# Patient Record
Sex: Male | Born: 1985 | Hispanic: No | Marital: Single | Smoking: Never smoker
Health system: Southern US, Community
[De-identification: ages and names within clinical notes are randomized; demographics above are authoritative.]

## PROBLEM LIST (undated history)

## (undated) DIAGNOSIS — J45909 Unspecified asthma, uncomplicated: Secondary | ICD-10-CM

---

## 2015-09-28 ENCOUNTER — Encounter (HOSPITAL_COMMUNITY): Payer: Self-pay | Admitting: Emergency Medicine

## 2015-09-28 ENCOUNTER — Ambulatory Visit (HOSPITAL_COMMUNITY)
Admission: EM | Admit: 2015-09-28 | Discharge: 2015-09-28 | Disposition: A | Discharge status: Home / Self Care | Payer: Self-pay | Source: Home / Self Care | Attending: Family Medicine | Admitting: Family Medicine

## 2015-09-28 DIAGNOSIS — J4521 Mild intermittent asthma with (acute) exacerbation: Secondary | ICD-10-CM

## 2015-09-28 HISTORY — DX: Unspecified asthma, uncomplicated: J45.909

## 2015-09-28 MED ORDER — METHYLPREDNISOLONE SODIUM SUCC 125 MG IJ SOLR
125.0000 mg | Freq: Once | INTRAMUSCULAR | Status: AC
  Administered 2015-09-28: 125 mg via INTRAMUSCULAR

## 2015-09-28 MED ORDER — IPRATROPIUM BROMIDE 0.02 % IN SOLN
RESPIRATORY_TRACT | Status: AC
  Filled 2015-09-28: qty 2.5

## 2015-09-28 MED ORDER — ALBUTEROL SULFATE HFA 108 (90 BASE) MCG/ACT IN AERS
2.0000 | INHALATION_SPRAY | Freq: Four times a day (QID) | RESPIRATORY_TRACT | Status: DC | PRN
  Administered 2015-09-28: 2 via RESPIRATORY_TRACT

## 2015-09-28 MED ORDER — IPRATROPIUM BROMIDE 0.02 % IN SOLN
0.5000 mg | Freq: Once | RESPIRATORY_TRACT | Status: AC
  Administered 2015-09-28: 0.5 mg via RESPIRATORY_TRACT

## 2015-09-28 MED ORDER — ALBUTEROL SULFATE (2.5 MG/3ML) 0.083% IN NEBU
5.0000 mg | INHALATION_SOLUTION | Freq: Once | RESPIRATORY_TRACT | Status: AC
  Administered 2015-09-28: 5 mg via RESPIRATORY_TRACT

## 2015-09-28 MED ORDER — ALBUTEROL SULFATE HFA 108 (90 BASE) MCG/ACT IN AERS
INHALATION_SPRAY | RESPIRATORY_TRACT | Status: AC
  Filled 2015-09-28: qty 6.7

## 2015-09-28 MED ORDER — METHYLPREDNISOLONE SODIUM SUCC 125 MG IJ SOLR
INTRAMUSCULAR | Status: AC
  Filled 2015-09-28: qty 2

## 2015-09-28 MED ORDER — ALBUTEROL SULFATE (2.5 MG/3ML) 0.083% IN NEBU
INHALATION_SOLUTION | RESPIRATORY_TRACT | Status: AC
  Filled 2015-09-28: qty 6

## 2015-09-28 NOTE — ED Notes (Signed)
Breathing difficulty for 2 months, history of asthma.  Stuffy nose, nose spray not helping.  Denies fever.  Reports generalized body aches, tired for one month.  Complains of sore throat.  Also has back pain.

## 2015-09-28 NOTE — ED Provider Notes (Signed)
CSN: [id number]     Arrival date & time 09/28/15  1513 History   First MD Initiated Contact with Patient 09/28/15 1616     Chief Complaint  Patient presents with  . Shortness of Breath   (Consider location/radiation/quality/duration/timing/severity/associated sxs/prior Treatment) Patient is a 30 y.o. male presenting with shortness of breath. The history is provided by the patient. The history is limited by a language barrier. A language interpreter was used.  Shortness of Breath Severity:  Mild Onset quality:  Gradual Duration:  2 months Progression:  Waxing and waning Chronicity:  Chronic Context: pollens and weather changes   Relieved by:  None tried Worsened by:  Nothing tried Ineffective treatments:  None tried Associated symptoms: wheezing   Associated symptoms: no fever     Past Medical History  Diagnosis Date  . Asthma    History reviewed. No pertinent past surgical history. No family history on file. Social History  Substance Use Topics  . Smoking status: Never Smoker   . Smokeless tobacco: None  . Alcohol Use: Yes    Review of Systems  Constitutional: Negative.  Negative for fever.  HENT: Negative.   Respiratory: Positive for shortness of breath and wheezing.   Cardiovascular: Negative.   All other systems reviewed and are negative.   Allergies  Review of patient's allergies indicates no known allergies.  Home Medications   Prior to Admission medications   Medication Sig Start Date End Date Taking? Authorizing Provider  OVER THE COUNTER MEDICATION "nose spray"   Yes Historical Provider, MD   Meds Ordered and Administered this Visit   Medications  albuterol (PROVENTIL HFA;VENTOLIN HFA) 108 (90 Base) MCG/ACT inhaler 2 puff (not administered)  methylPREDNISolone sodium succinate (SOLU-MEDROL) 125 mg/2 mL injection 125 mg (125 mg Intramuscular Given 09/28/15 1746)  albuterol (PROVENTIL) (2.5 MG/3ML) 0.083% nebulizer solution 5 mg (5 mg Nebulization Given  09/28/15 1727)  ipratropium (ATROVENT) nebulizer solution 0.5 mg (0.5 mg Nebulization Given 09/28/15 1727)    BP 165/99 mmHg  Pulse 71  Temp(Src) 98.5 F (36.9 C) (Oral)  Resp 18  SpO2 100% No data found.   Physical Exam  Constitutional: He is oriented to person, place, and time. He appears well-developed and well-nourished. No distress.  HENT:  Mouth/Throat: Oropharynx is clear and moist.  Neck: Normal range of motion. Neck supple.  Cardiovascular: Normal rate, normal heart sounds and intact distal pulses.   Pulmonary/Chest: Effort normal and breath sounds normal. No respiratory distress. He has no wheezes.  Abdominal: Soft. Bowel sounds are normal.  Lymphadenopathy:    He has no cervical adenopathy.  Neurological: He is alert and oriented to person, place, and time.  Nursing note and vitals reviewed.   ED Course  Procedures (including critical care time)  Labs Review Labs Reviewed - No data to display  Imaging Review No results found.   Visual Acuity Review  Right Eye Distance:   Left Eye Distance:   Bilateral Distance:    Right Eye Near:   Left Eye Near:    Bilateral Near:         MDM   1. Asthma exacerbation attacks, mild intermittent    Sx improved after neb. Meds ordered this encounter  Medications  . OVER THE COUNTER MEDICATION    Sig: "nose spray"  . methylPREDNISolone sodium succinate (SOLU-MEDROL) 125 mg/2 mL injection 125 mg    Sig:   . albuterol (PROVENTIL) (2.5 MG/3ML) 0.083% nebulizer solution 5 mg    Sig:   . ipratropium (  ATROVENT) nebulizer solution 0.5 mg    Sig:   . albuterol (PROVENTIL HFA;VENTOLIN HFA) 108 (90 Base) MCG/ACT inhaler 2 puff    Sig:       Linna HoffJames D Javaris Wigington, MD 09/28/15 1752

## 2015-09-28 NOTE — Discharge Instructions (Signed)
Return if any problems.

## 2018-09-11 ENCOUNTER — Other Ambulatory Visit: Payer: Self-pay

## 2018-09-11 ENCOUNTER — Ambulatory Visit (HOSPITAL_COMMUNITY)
Admission: EM | Admit: 2018-09-11 | Discharge: 2018-09-11 | Disposition: A | Discharge status: Home / Self Care | Payer: Self-pay | Attending: Family Medicine | Admitting: Family Medicine

## 2018-09-11 ENCOUNTER — Encounter (HOSPITAL_COMMUNITY): Payer: Self-pay

## 2018-09-11 DIAGNOSIS — J4521 Mild intermittent asthma with (acute) exacerbation: Secondary | ICD-10-CM

## 2018-09-11 DIAGNOSIS — J3089 Other allergic rhinitis: Secondary | ICD-10-CM

## 2018-09-11 MED ORDER — ALBUTEROL SULFATE HFA 108 (90 BASE) MCG/ACT IN AERS
INHALATION_SPRAY | RESPIRATORY_TRACT | Status: AC
  Filled 2018-09-11: qty 6.7

## 2018-09-11 MED ORDER — CETIRIZINE HCL 10 MG PO TABS: 10.0000 mg | ORAL_TABLET | Freq: Every day | ORAL | 0 refills | Status: DC

## 2018-09-11 MED ORDER — OLOPATADINE HCL 0.2 % OP SOLN: 1.0000 [drp] | Freq: Every day | OPHTHALMIC | 0 refills | Status: DC

## 2018-09-11 MED ORDER — ALBUTEROL SULFATE HFA 108 (90 BASE) MCG/ACT IN AERS
2.0000 | INHALATION_SPRAY | Freq: Once | RESPIRATORY_TRACT | Status: AC
  Administered 2018-09-11: 2 via RESPIRATORY_TRACT

## 2018-09-11 NOTE — ED Triage Notes (Signed)
Pt cc he has stomach and chest discomfort this started today. Pt states its a burning pain and his eyes itch and get watery.

## 2018-09-11 NOTE — ED Provider Notes (Signed)
[facility]    CSN: [id number] Arrival date & time: 09/11/18  1939     History   Chief Complaint Chief Complaint  Patient presents with  . Abdominal Pain    HPI Ladislaus Repsher is a 33 y.o. male.   33 year old male comes in for evaluation of chest pain and abdominal pain. HPI obtained by patient through video translator. Patient at first kept stating pain through the whole front of body. 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 During this process he has muscle soreness for the past 4 days that was at first intermittent, but now more constant. He has some wheezing and shortness of breath while he works, which improves slightly after relieved. He has a history of asthma, but does not have an inhaler to use. He has also noted to have 1 month history of right eye pain that waters and itches. He also has rhinorrhea, nasal congestion, sneezing. Patient seems to be eating and drinking without problems. No obvious nausea, vomiting.      Past Medical History:  Diagnosis Date  . Asthma     There are no active problems to display for this patient.   History reviewed. No pertinent surgical history.     Home Medications    Prior to Admission medications   Medication Sig Start Date End Date Taking? Authorizing Provider  cetirizine (ZYRTEC) 10 MG tablet Take 1 tablet (10 mg total) by mouth daily. 09/11/18   Cathie Hoops, Reeta Kuk V, PA-C  Olopatadine HCl 0.2 % SOLN Apply 1 drop to eye daily. 09/11/18   Belinda Fisher, PA-C  OVER THE COUNTER MEDICATION "nose spray"    [provider]    Family History History reviewed. No pertinent family history.  Social History Social History   Tobacco Use  . Smoking status: Never Smoker  . Smokeless tobacco: Never Used  Substance Use Topics  . Alcohol use: Yes  . Drug use: No     Allergies   Patient has no known allergies.   Review of Systems Review of  Systems  Reason unable to perform ROS: See HPI as above.     Physical Exam Triage Vital Signs ED Triage Vitals  Enc Vitals Group     BP 09/11/18 1946 131/87     Pulse Rate 09/11/18 1946 87     Resp 09/11/18 1946 16     Temp 09/11/18 1946 97.8 F (36.6 C)     Temp Source 09/11/18 1946 Oral     SpO2 09/11/18 1946 100 %     Weight 09/11/18 1948 118 lb 6.4 oz (53.7 kg)     Height --      Head Circumference --      Peak Flow --      Pain Score 09/11/18 1947 5     Pain Loc --      Pain Edu? --      Excl. in GC? --    No data found.  Updated Vital Signs BP 131/87 (BP Location: Right Arm)   Pulse 87   Temp 97.8 F (36.6 C) (Oral)   Resp 16   Wt 118 lb 6.4 oz (53.7 kg)   SpO2 100%   Physical Exam Constitutional:      General: He is not in acute distress.    Appearance: He is well-developed. He is not ill-appearing, toxic-appearing or diaphoretic.  HENT:     Head: Normocephalic  and atraumatic.     Right Ear: Tympanic membrane, ear canal and external ear normal. Tympanic membrane is not erythematous or bulging.     Left Ear: Tympanic membrane, ear canal and external ear normal. Tympanic membrane is not erythematous or bulging.     Nose: Nose normal.     Right Sinus: No maxillary sinus tenderness or frontal sinus tenderness.     Left Sinus: No maxillary sinus tenderness or frontal sinus tenderness.     Mouth/Throat:     Mouth: Mucous membranes are moist.     Pharynx: Oropharynx is clear. Uvula midline.  Eyes:     General: Lids are normal. Lids are everted, no foreign bodies appreciated.     Extraocular Movements: Extraocular movements intact.     Conjunctiva/sclera:     Right eye: Right conjunctiva is injected.     Left eye: Left conjunctiva is injected.     Pupils: Pupils are equal, round, and reactive to light.     Comments: Patient with right nasal pinguecula.  Neck:     Musculoskeletal: Normal range of motion and neck supple.  Cardiovascular:     Rate and Rhythm:  Normal rate and regular rhythm.     Heart sounds: Normal heart sounds. No murmur. No friction rub. No gallop.   Pulmonary:     Effort: Pulmonary effort is normal. No accessory muscle usage, prolonged expiration, respiratory distress or retractions.     Breath sounds: Normal breath sounds. No stridor, decreased air movement or transmitted upper airway sounds. No decreased breath sounds, wheezing, rhonchi or rales.  Abdominal:     General: Bowel sounds are normal.     Palpations: Abdomen is soft.     Tenderness: There is no abdominal tenderness. There is no right CVA tenderness, left CVA tenderness, guarding or rebound.  Skin:    General: Skin is warm and dry.  Neurological:     Mental Status: He is alert and oriented to person, place, and time.      UC Treatments / Results  Labs (all labs ordered are listed, but only abnormal results are displayed) Labs Reviewed - No data to display  EKG None  Radiology No results found.  Procedures Procedures (including critical care time)  Medications Ordered in UC Medications  albuterol (PROVENTIL HFA;VENTOLIN HFA) 108 (90 Base) MCG/ACT inhaler 2 puff (2 puffs Inhalation Given 09/11/18 2021)    Initial Impression / Assessment and Plan / UC Course  I have reviewed the triage vital signs and the nursing notes.  Pertinent labs & imaging results that were available during my care of the patient were reviewed by me and considered in my medical decision making (see chart for details).    Discussed possible asthma exacerbation given recent new job requiring strenuous activity.  Lungs clear to auscultation bilaterally without adventitious lung sounds at this time.  Will provide albuterol inhaler.  History and exam also consistent with allergies with possible allergic conjunctivitis.  Will provide antihistamine, Pataday as directed.  Return precautions given.  Patient expresses understanding and agrees to plan.  Final Clinical Impressions(s) / UC  Diagnoses   Final diagnoses:  Mild intermittent asthma with acute exacerbation  Non-seasonal allergic rhinitis due to other allergic trigger    ED Prescriptions    Medication Sig Dispense Auth. Provider   cetirizine (ZYRTEC) 10 MG tablet Take 1 tablet (10 mg total) by mouth daily. 30 tablet Aryahna Spagna V, PA-C   Olopatadine HCl 0.2 % SOLN Apply 1 drop to  eye daily. 2.5 mL Threasa Alpha, [place] 09/11/18 2026

## 2018-09-11 NOTE — Discharge Instructions (Signed)
Use albuterol every 4-6 hours as needed for shortness of breath/wheezing. Start zyrtec as directed. Pataday for the eyes to help the irritation. Follow up with PCP for further evaluation if symptoms not improving.

## 2018-10-20 ENCOUNTER — Encounter (HOSPITAL_COMMUNITY): Payer: Self-pay | Admitting: *Deleted

## 2018-10-20 ENCOUNTER — Other Ambulatory Visit: Payer: Self-pay

## 2018-10-20 ENCOUNTER — Emergency Department (HOSPITAL_COMMUNITY)
Admission: EM | Admit: 2018-10-20 | Discharge: 2018-10-20 | Disposition: A | Discharge status: Home / Self Care | Payer: Self-pay | Attending: Emergency Medicine | Admitting: Emergency Medicine

## 2018-10-20 ENCOUNTER — Emergency Department (HOSPITAL_COMMUNITY): Payer: Self-pay

## 2018-10-20 DIAGNOSIS — S91109A Unspecified open wound of unspecified toe(s) without damage to nail, initial encounter: Secondary | ICD-10-CM | POA: Insufficient documentation

## 2018-10-20 DIAGNOSIS — S92515A Nondisplaced fracture of proximal phalanx of left lesser toe(s), initial encounter for closed fracture: Secondary | ICD-10-CM | POA: Insufficient documentation

## 2018-10-20 DIAGNOSIS — Y939 Activity, unspecified: Secondary | ICD-10-CM | POA: Insufficient documentation

## 2018-10-20 DIAGNOSIS — W109XXA Fall (on) (from) unspecified stairs and steps, initial encounter: Secondary | ICD-10-CM | POA: Insufficient documentation

## 2018-10-20 DIAGNOSIS — Y929 Unspecified place or not applicable: Secondary | ICD-10-CM | POA: Insufficient documentation

## 2018-10-20 DIAGNOSIS — Z23 Encounter for immunization: Secondary | ICD-10-CM | POA: Insufficient documentation

## 2018-10-20 DIAGNOSIS — S93105A Unspecified dislocation of left toe(s), initial encounter: Secondary | ICD-10-CM | POA: Insufficient documentation

## 2018-10-20 DIAGNOSIS — Y999 Unspecified external cause status: Secondary | ICD-10-CM | POA: Insufficient documentation

## 2018-10-20 MED ORDER — TETANUS-DIPHTH-ACELL PERTUSSIS 5-2.5-18.5 LF-MCG/0.5 IM SUSP
0.5000 mL | Freq: Once | INTRAMUSCULAR | Status: AC
  Administered 2018-10-20: 05:00:00 0.5 mL via INTRAMUSCULAR
  Filled 2018-10-20: qty 0.5

## 2018-10-20 MED ORDER — OXYCODONE-ACETAMINOPHEN 5-325 MG PO TABS
1.0000 | ORAL_TABLET | Freq: Once | ORAL | Status: AC
  Administered 2018-10-20: 1 via ORAL
  Filled 2018-10-20: qty 1

## 2018-10-20 MED ORDER — LIDOCAINE HCL (PF) 1 % IJ SOLN
5.0000 mL | Freq: Once | INTRAMUSCULAR | Status: AC
  Administered 2018-10-20: 06:00:00 5 mL
  Filled 2018-10-20: qty 5

## 2018-10-20 MED ORDER — SODIUM CHLORIDE 0.9 % IV SOLN
3.0000 g | Freq: Once | INTRAVENOUS | Status: AC
  Administered 2018-10-20: 05:00:00 3 g via INTRAVENOUS
  Filled 2018-10-20: qty 3

## 2018-10-20 MED ORDER — HYDROCODONE-ACETAMINOPHEN 5-325 MG PO TABS: 1.0000 | ORAL_TABLET | Freq: Four times a day (QID) | ORAL | 0 refills | Status: DC | PRN

## 2018-10-20 MED ORDER — CEFAZOLIN SODIUM-DEXTROSE 2-4 GM/100ML-% IV SOLN: 2.0000 g | Freq: Once | INTRAVENOUS | Status: DC

## 2018-10-20 MED ORDER — AMOXICILLIN-POT CLAVULANATE 875-125 MG PO TABS
1.0000 | ORAL_TABLET | Freq: Two times a day (BID) | ORAL | 0 refills | Status: AC
Start: 2018-10-20 — End: 2018-10-27

## 2018-10-20 MED ORDER — LIDOCAINE-EPINEPHRINE-TETRACAINE (LET) SOLUTION
3.0000 mL | Freq: Once | NASAL | Status: DC
  Filled 2018-10-20: qty 3

## 2018-10-20 MED ORDER — TETANUS-DIPHTH-ACELL PERTUSSIS 5-2.5-18.5 LF-MCG/0.5 IM SUSP: 0.5000 mL | Freq: Once | INTRAMUSCULAR | Status: DC

## 2018-10-20 MED ORDER — LIDOCAINE HCL (PF) 1 % IJ SOLN
10.0000 mL | Freq: Once | INTRAMUSCULAR | Status: DC
  Filled 2018-10-20: qty 10

## 2018-10-20 MED ORDER — FENTANYL CITRATE (PF) 100 MCG/2ML IJ SOLN
50.0000 ug | Freq: Once | INTRAMUSCULAR | Status: DC
Start: 2018-10-20 — End: 2018-10-20

## 2018-10-20 NOTE — ED Provider Notes (Signed)
Medical screening examination/treatment/procedure(s) were conducted as a shared visit with non-physician practitioner(s) and myself.  I personally evaluated the patient during the encounter.  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 Open interphalangeal joint, unstable.  Plan for abx, clean, ortho consult for further recommendations.   None     Latayvia Mandujano, Barbara Cower, MD 10/21/18 [phone]

## 2018-10-20 NOTE — ED Provider Notes (Signed)
[facility]   CSN: [id number] Arrival date & time: 10/20/18  0055    History   Chief Complaint Chief Complaint  Patient presents with  . Foot Injury    HPI Corey Anthony is a 33 y.o. male.     HPI  Patient is 33 year old male with past medical history of asthma presenting for foot injury.  Patient reports that he missed the last 2 steps, tripped, and his foot went into the nail.  He reports that he lacerated the bottom of his right foot.  Patient was barefoot at the time.  He denies any loss of sensation.  He reports he was unable to walk on the foot due to the pain.  He did not sustain any other injuries in the incident and did not hit his head.  Patient reports that his most recent tetanus shot was within the last year given by his employer.  A [place] interpreter was used for the entirety of this encounter.  Past Medical History:  Diagnosis Date  . Asthma     There are no active problems to display for this patient.   History reviewed. No pertinent surgical history.      Home Medications    Prior to Admission medications   Medication Sig Start Date End Date Taking? Authorizing Provider  cetirizine (ZYRTEC) 10 MG tablet Take 1 tablet (10 mg total) by mouth daily. 09/11/18   Cathie HoopsYu, Amy V, PA-C  Olopatadine HCl 0.2 % SOLN Apply 1 drop to eye daily. 09/11/18   Belinda FisherYu, Amy V, PA-C  OVER THE COUNTER MEDICATION "nose spray"    [provider]    Family History No family history on file.  Social History Social History   Tobacco Use  . Smoking status: Never Smoker  . Smokeless tobacco: Never Used  Substance Use Topics  . Alcohol use: Yes  . Drug use: No     Allergies   Patient has no known allergies.   Review of Systems Review of Systems  Musculoskeletal: Positive for arthralgias.  Skin: Positive for wound.  Neurological: Negative for syncope, weakness and numbness.  All other systems reviewed and  are negative.    Physical Exam Updated Vital Signs BP (!) 140/122 (BP Location: Right Arm)   Pulse (!) 106   Temp 98.2 F (36.8 C) (Oral)   Resp 20   Ht 5' (1.524 m)   Wt 53.7 kg   BMI 23.12 kg/m   Physical Exam Vitals signs and nursing note reviewed.  Constitutional:      General: He is not in acute distress.    Appearance: He is well-developed. He is not diaphoretic.     Comments: Sitting comfortably in bed.  HENT:     Head: Normocephalic and atraumatic.  Eyes:     General:        Right eye: No discharge.        Left eye: No discharge.     Conjunctiva/sclera: Conjunctivae normal.     Comments: EOMs normal to gross examination.  Neck:     Musculoskeletal: Normal range of motion.  Cardiovascular:     Rate and Rhythm: Normal rate and regular rhythm.     Comments: Intact, 2+ DP pulse bilaterally. Pulmonary:     Comments: Patient converses comfortably without audible wheeze or stridor. Abdominal:     General: There is no distension.  Musculoskeletal: Normal range of motion.     Comments: See clinical photo for  details.  Patient has exposed bone of proximal phalanx of the fifth digit of the left toe.  There is a laceration at the base of the fifth toe.  Patient also has a laceration without exposed bone at the base of the left fourth toe.  Skin:    General: Skin is warm and dry.  Neurological:     Mental Status: He is alert.     Comments: Cranial nerves intact to gross observation. Patient moves extremities without difficulty.  Psychiatric:        Behavior: Behavior normal.        Thought Content: Thought content normal.        Judgment: Judgment normal.            ED Treatments / Results  Labs (all labs ordered are listed, but only abnormal results are displayed) Labs Reviewed - No data to display  EKG None  Radiology No results found.  Procedures Irrigation Date/Time: 10/20/2018 7:08 AM Performed by: Elisha Ponder, PA-C Authorized by:  Elisha Ponder, PA-C  Consent: Verbal consent obtained. Risks and benefits: risks, benefits and alternatives were discussed Consent given by: patient Patient understanding: patient states understanding of the procedure being performed Patient consent: the patient's understanding of the procedure matches consent given Patient identity confirmed: verbally with patient Preparation: Patient was prepped and draped in the usual sterile fashion. Local anesthesia used: yes Anesthesia: digital block  Anesthesia: Local anesthesia used: yes Local Anesthetic: lidocaine 1% without epinephrine Anesthetic total: 1000 mL  Sedation: Patient sedated: no  Patient tolerance: Patient tolerated the procedure well with no immediate complications  .Marland KitchenLaceration Repair Date/Time: 10/20/2018 7:10 AM Performed by: Elisha Ponder, PA-C Authorized by: Elisha Ponder, PA-C   Consent:    Consent obtained:  Verbal   Consent given by:  Patient   Risks discussed:  Infection and pain Anesthesia (see MAR for exact dosages):    Anesthesia method:  Nerve block   Block needle gauge:  25 G   Block anesthetic:  Lidocaine 1% w/o epi   Block technique:  Digital block   Block injection procedure:  Anatomic landmarks identified, introduced needle, incremental injection, negative aspiration for blood and anatomic landmarks palpated   Block outcome:  Anesthesia achieved Laceration details:    Location:  Toe   Toe location:  L little toe   Length (cm):  3 Repair type:    Repair type:  Simple Pre-procedure details:    Preparation:  Patient was prepped and draped in usual sterile fashion Exploration:    Hemostasis achieved with:  Direct pressure   Wound exploration: wound explored through full range of motion     Contaminated: no   Treatment:    Area cleansed with:  Betadine   Amount of cleaning:  Extensive   Irrigation solution:  Sterile water   Irrigation volume:  1000 ml   Irrigation method:  Syringe  Skin repair:    Repair method:  Sutures   Suture size:  4-0   Suture material:  Prolene   Suture technique:  Simple interrupted   Number of sutures:  3 Approximation:    Approximation:  Close Post-procedure details:    Dressing:  Non-adherent dressing and bulky dressing (Buddy taping for protection)   Patient tolerance of procedure:  Tolerated well, no immediate complications .Marland KitchenLaceration Repair Date/Time: 10/20/2018 7:12 AM Performed by: Elisha Ponder, PA-C Authorized by: Elisha Ponder, PA-C   Consent:    Consent obtained:  Verbal  Consent given by:  Patient   Risks discussed:  Infection and pain Anesthesia (see MAR for exact dosages):    Anesthesia method:  Nerve block   Block needle gauge:  25 G   Block anesthetic:  Lidocaine 1% w/o epi   Block technique:  Digital block   Block injection procedure:  Anatomic landmarks identified, introduced needle, incremental injection, negative aspiration for blood and anatomic landmarks palpated   Block outcome:  Anesthesia achieved Laceration details:    Location:  Toe   Toe location:  L fourth toe   Length (cm):  2 Repair type:    Repair type:  Simple Pre-procedure details:    Preparation:  Patient was prepped and draped in usual sterile fashion Exploration:    Hemostasis achieved with:  Direct pressure   Wound exploration: wound explored through full range of motion     Contaminated: no   Treatment:    Area cleansed with:  Betadine   Amount of cleaning:  Extensive   Irrigation solution:  Sterile water   Irrigation volume:  500 ml   Irrigation method:  Syringe Skin repair:    Repair method:  Sutures   Suture size:  4-0   Suture material:  Prolene   Suture technique:  Simple interrupted   Number of sutures:  2 Approximation:    Approximation:  Close Post-procedure details:    Dressing:  Non-adherent dressing and sterile dressing (Buddy taping for protection)   Patient tolerance of procedure:  Tolerated well, no  immediate complications   (including critical care time)  Medications Ordered in ED Medications  oxyCODONE-acetaminophen (PERCOCET/ROXICET) 5-325 MG per tablet 1 tablet (has no administration in time range)  lidocaine-EPINEPHrine-tetracaine (LET) solution (has no administration in time range)       Initial Impression / Assessment and Plan / ED Course  I have reviewed the triage vital signs and the nursing notes.  Pertinent labs & imaging results that were available during my care of the patient were reviewed by me and considered in my medical decision making (see chart for details).  Clinical Course as of Oct 20 634  Sun Oct 20, 2018  0428 Spoke with Dr. Duwayne Heck who will review films and return call. Appreciate his involvement.    [AM]  [phone] Dr. Aundria Rud orthopedics reviewed clinical photos and recommended tacking the left fifth toe back together with 3-4 sutures.  Recommends Unasyn followed by Augmentin as an outpatient.  He will see the patient in 5 days in clinic.  Recommends buddy taping all affected toes, post op shoe.  Appreciate his involvement.   [AM]    Clinical Course User Index [AM] Elisha Ponder, PA-C       Patient is nontoxic-appearing neurovascularly intact in the left lower extremity.  Patient has a partial amputation/open dislocation of the proximal and distal phalanx of the left fifth toe.  Area was copiously cleansed with 1 L of sterile water.  Affected toes were approximated together.  All toes buddy wrap together.  Patient instructed that he must take antibiotics, and follow-up with orthopedics in 5 days with a Burmese interpreter present.  Patient was instructed on proper wound care and dressing changes.  Patient given return precautions for any increasing pain, swelling, or purulent drainage.  Patient is in understanding and agrees with plan of care.  This is a shared visit with Dr. Marily Memos. Patient was independently evaluated by this attending  physician. Attending physician consulted in evaluation and discharge management.  Final  Clinical Impressions(s) / ED Diagnoses   Final diagnoses:  Open dislocation of phalanx of foot, left, initial encounter  Closed nondisplaced fracture of proximal phalanx of lesser toe of left foot, initial encounter    ED Discharge Orders         Ordered    amoxicillin-clavulanate (AUGMENTIN) 875-125 MG tablet  Every 12 hours     10/20/18 0642    HYDROcodone-acetaminophen (NORCO/VICODIN) 5-325 MG tablet  Every 6 hours PRN     10/20/18 0642           Elisha Ponder, PA-C 10/20/18 0719    Mesner, Barbara Cower, MD 10/21/18 [phone]

## 2018-10-20 NOTE — Progress Notes (Signed)
Orthopedic Tech Progress Note Patient Details:  Corey Anthony 1986-03-24 [id number]  Ortho Devices Type of Ortho Device: Crutches, Postop shoe/boot Ortho Device/Splint Location: LLE Ortho Device/Splint Interventions: Adjustment, Application, Ordered   Post Interventions Patient Tolerated: Well Instructions Provided: Poper ambulation with device, Care of device, Adjustment of device   Donald Pore 10/20/2018, 7:24 AM

## 2018-10-20 NOTE — Discharge Instructions (Signed)
Please see the information and instructions below regarding your visit.  Your diagnoses today include:  1. Open dislocation of phalanx of foot, left, initial encounter   2. Closed nondisplaced fracture of proximal phalanx of lesser toe of left foot, initial encounter     Tests performed today include: X-ray of the affected area showed a fracture in your third toe.  Vital signs. See below for your results today.   Medications prescribed:   Take any prescribed medications only as directed.   Please take all of your antibiotics until finished.   Take the antibiotics twice daily for the next 7 days.  You may develop abdominal discomfort or nausea from the antibiotic. If this occurs, you may take it with food. Some patients also get diarrhea with antibiotics. You may help offset this with probiotics which you can buy or get in yogurt. Do not eat or take the probiotics until 2 hours after your antibiotic. Some women develop vaginal yeast infections after antibiotics. If you develop unusual vaginal discharge after being on this medication, please see your primary care provider.   Some people develop allergies to antibiotics. Symptoms of antibiotic allergy can be mild and include a flat rash and itching. They can also be more serious and include:  ?Hives - Hives are raised, red patches of skin that are usually very itchy.  ?Lip or tongue swelling  ?Trouble swallowing or breathing  ?Blistering of the skin or mouth.  If you have any of these serious symptoms, please seek emergency medical care immediately.   You have been prescribed Norco for pain. This is an opioid pain medication. You may take this medication every 4-6 hours as needed for pain. Only take this medication if you need it for breakthrough pain. You may combine this medicine with ibuprofen, a non-steroidal anti-inflammatory drug (NSAID) every 6 hours, so you are getting something for pain relief every 3 hours.  Do not combine this  medication with Tylenol, as it may increase the risk of liver problems.  Do not combine this medication with alcohol.  Please be advised to avoid driving or operating heavy machinery while taking this medication, as it may make you drowsy or impair judgment.    Home care instructions:  Follow any educational materials and wound care instructions contained in this packet.   Keep affected area above the level of your heart when possible to minimize swelling. Wash area gently twice a day with warm soapy water. Do not apply alcohol or hydrogen peroxide directly over a wound. Cover the area if it is draining or weeping. Keep the bandage in place for 24 hours and refrain from getting the wound wet for 24 hours. After that, you may get the area wet, but please ensure that you dry it completely afterwards.  Please apply the yellow gauze on top of the wound first, followed by the white gauze.  Please then wrap your toes together with the stretchy gauze. You will need to wear the postoperative shoe and weight-bear on the left foot as tolerated.  Use the crutches as needed.  Follow-up instructions: Suture Removal: Dr. Aundria Rudogers will take out your sutures.  Return instructions:  Return to the Emergency Department if you have: Fever Worsening pain Worsening swelling of the wound Pus draining from the wound Redness of the skin that moves away from the wound, especially if it streaks away from the affected area  Any other emergent concerns  Your vital signs today were: BP 116/76  Pulse 83    Temp 98.2 F (36.8 C) (Oral)    Resp 20    Ht 5' (1.524 m)    Wt 53.7 kg    SpO2 95%    BMI 23.12 kg/m  If your blood pressure (BP) was elevated on multiple readings during this visit above 130 for the top number or above 80 for the bottom number, please have this repeated by your primary care provider within one month. --------------  Thank you for allowing [place] to participate in your care today! It was a  pleasure taking care of you.

## 2018-10-20 NOTE — ED Triage Notes (Signed)
The pt speaks burmise he speaks little or no english  He fell earlier tonight c/o pain in his lt foot with sl swelling

## 2018-10-31 ENCOUNTER — Encounter (HOSPITAL_COMMUNITY): Payer: Self-pay | Admitting: Emergency Medicine

## 2018-10-31 ENCOUNTER — Other Ambulatory Visit: Payer: Self-pay

## 2018-10-31 ENCOUNTER — Emergency Department (HOSPITAL_COMMUNITY)
Admission: EM | Admit: 2018-10-31 | Discharge: 2018-10-31 | Disposition: A | Discharge status: Home / Self Care | Payer: Self-pay | Attending: Emergency Medicine | Admitting: Emergency Medicine

## 2018-10-31 DIAGNOSIS — R509 Fever, unspecified: Secondary | ICD-10-CM | POA: Insufficient documentation

## 2018-10-31 DIAGNOSIS — Z5189 Encounter for other specified aftercare: Secondary | ICD-10-CM

## 2018-10-31 DIAGNOSIS — Z4802 Encounter for removal of sutures: Secondary | ICD-10-CM | POA: Insufficient documentation

## 2018-10-31 DIAGNOSIS — J45909 Unspecified asthma, uncomplicated: Secondary | ICD-10-CM | POA: Insufficient documentation

## 2018-10-31 DIAGNOSIS — S91312D Laceration without foreign body, left foot, subsequent encounter: Secondary | ICD-10-CM | POA: Insufficient documentation

## 2018-10-31 DIAGNOSIS — Z79899 Other long term (current) drug therapy: Secondary | ICD-10-CM | POA: Insufficient documentation

## 2018-10-31 DIAGNOSIS — S92505D Nondisplaced unspecified fracture of left lesser toe(s), subsequent encounter for fracture with routine healing: Secondary | ICD-10-CM | POA: Insufficient documentation

## 2018-10-31 DIAGNOSIS — X58XXXD Exposure to other specified factors, subsequent encounter: Secondary | ICD-10-CM | POA: Insufficient documentation

## 2018-10-31 MED ORDER — SULFAMETHOXAZOLE-TRIMETHOPRIM 800-160 MG PO TABS: 1.0000 | ORAL_TABLET | Freq: Two times a day (BID) | ORAL | 0 refills | Status: AC

## 2018-10-31 MED ORDER — DOUBLE ANTIBIOTIC 500-10000 UNIT/GM EX OINT
TOPICAL_OINTMENT | Freq: Once | CUTANEOUS | Status: AC
  Administered 2018-10-31: 17:00:00 via TOPICAL
  Filled 2018-10-31: qty 28.4

## 2018-10-31 NOTE — Discharge Instructions (Signed)
Please call Dr. Aundria Rud office TODAY to schedule an appointment. It is important that you are seen by them. Please take antibiotics as prescribed.

## 2018-10-31 NOTE — ED Triage Notes (Signed)
Patient seen in the ED 10/20/18 walking and tripped onto a heater and injured left foot. Sutures placed and concerned of bleeding around sutures and to have them possibly removed.

## 2018-11-01 NOTE — ED Provider Notes (Signed)
[facility] Note   CSN: [id number] Arrival date & time: 10/31/18  1344    History   Chief Complaint Chief Complaint  Patient presents with  . Wound Check  . Suture / Staple Removal    HPI Corey Anthony is a 33 y.o. male who presents to the ED for wound check/suture removal. Pt was seen on 04/26 for a foot injury with lacerations to his 4th and 5th digit on left foot as well as a closed non displaced fracture of 3rd proximal phalanx. He had 5 sutures placed in total and was given a follow up with orthopedist Dr. Aundria Rudogers on 05/01. Pt returns today with sutures still in place complaining of mild pain and bleeding around the site. Through interpretor services he reports he has not seen orthopedist and believes it was due to a communication error given the language barrier. Pt denies fever, chills, drainage, erythema, or any other associated symptoms.      The history is provided by the patient. The history is limited by a language barrier. A language interpreter was used.    Past Medical History:  Diagnosis Date  . Asthma     There are no active problems to display for this patient.   History reviewed. No pertinent surgical history.      Home Medications    Prior to Admission medications   Medication Sig Start Date End Date Taking? Authorizing Provider  cetirizine (ZYRTEC) 10 MG tablet Take 1 tablet (10 mg total) by mouth daily. 09/11/18   Cathie HoopsYu, Amy V, PA-C  HYDROcodone-acetaminophen (NORCO/VICODIN) 5-325 MG tablet Take 1-2 tablets by mouth every 6 (six) hours as needed. 10/20/18   Aviva KluverMurray, Alyssa B, PA-C  Olopatadine HCl 0.2 % SOLN Apply 1 drop to eye daily. 09/11/18   Cathie HoopsYu, Amy V, PA-C  OVER THE COUNTER MEDICATION "nose spray"    [provider]  sulfamethoxazole-trimethoprim (BACTRIM DS) 800-160 MG tablet Take 1 tablet by mouth 2 (two) times daily for 7 days. 10/31/18 11/07/18  Tanda RockersVenter, Kataleia Quaranta, PA-C    Family History No family  history on file.  Social History Social History   Tobacco Use  . Smoking status: Never Smoker  . Smokeless tobacco: Never Used  Substance Use Topics  . Alcohol use: Yes  . Drug use: No     Allergies   Patient has no known allergies.   Review of Systems Review of Systems  Constitutional: Positive for chills and fever.  Skin: Positive for color change and wound.     Physical Exam Updated Vital Signs BP (!) 136/101   Pulse 71   Temp 98 F (36.7 C) (Oral)   Resp 18   Ht 5' (1.524 m)   Wt 53 kg   SpO2 100%   BMI 22.82 kg/m   Physical Exam Vitals signs and nursing note reviewed.  Constitutional:      Appearance: He is not ill-appearing.  HENT:     Head: Normocephalic and atraumatic.  Eyes:     Conjunctiva/sclera: Conjunctivae normal.  Cardiovascular:     Rate and Rhythm: Normal rate and regular rhythm.  Pulmonary:     Effort: Pulmonary effort is normal.     Breath sounds: Normal breath sounds.  Musculoskeletal:     Comments: Sutures in place to 4th and 5th digit on left foot; no erythema or edema involved. Small amount of purulent drainage to web space between 4th and 5th toe. Sensation intact. Able to wiggle toes. Cap refill <  2 seconds. 2+ DP pulse.   Skin:    General: Skin is warm and dry.     Coloration: Skin is not jaundiced.  Neurological:     Mental Status: He is alert.      ED Treatments / Results  Labs (all labs ordered are listed, but only abnormal results are displayed) Labs Reviewed - No data to display  EKG None  Radiology No results found.  Procedures .Suture Removal Date/Time: 11/01/2018 11:22 AM Performed by: Tanda Rockers, PA-C Authorized by: Tanda Rockers, PA-C   Consent:    Consent obtained:  Verbal   Consent given by:  Patient   Risks discussed:  Pain Location:    Location:  Lower extremity   Lower extremity location:  Toe   Toe location:  L fourth toe Procedure details:    Wound appearance:  Moist and purulent    Number of sutures removed:  2 Post-procedure details:    Post-removal:  Antibiotic ointment applied and dressing applied   Patient tolerance of procedure:  Tolerated well, no immediate complications .Suture Removal Date/Time: 11/01/2018 11:23 AM Performed by: Tanda Rockers, PA-C Authorized by: Tanda Rockers, PA-C   Consent:    Consent obtained:  Verbal   Consent given by:  Patient   Risks discussed:  Pain Location:    Location:  Lower extremity   Lower extremity location:  Toe   Toe location:  L little toe Procedure details:    Wound appearance:  Purulent   Number of sutures removed:  3 Post-procedure details:    Post-removal:  Antibiotic ointment applied and dressing applied   Patient tolerance of procedure:  Tolerated well, no immediate complications   (including critical care time)  Medications Ordered in ED Medications  polymixin-bacitracin (POLYSPORIN) ointment ( Topical Given 10/31/18 1705)     Initial Impression / Assessment and Plan / ED Course  I have reviewed the triage vital signs and the nursing notes.  Pertinent labs & imaging results that were available during my care of the patient were reviewed by me and considered in my medical decision making (see chart for details).    Pt is a 33 year old male who speaks [place]ese. He presents for wound check/suture removal. Pt had follow up appointment scheduled with Dr. Aundria Rud orthopedics for 05/01 for evaluation of his foot but did not go to the appointment; believe it may have been a language barrier issue. Sutures were removed in the ED today; had long discussion through interpretor services regarding immediate need to call Dr. Aundria Rud office to schedule follow up appointment given fracture of 3rd digit; pt is in agreement with plan. Given small amount of drainage will prescribe Bactrim outpatient for patient to take; he was initially prescribed keflex after his ED visit on 04/26 but would like to cover him until he sees  ortho. Pt afebrile in the ED today; do not feel he needs immediate ortho consult. Pt in agreement with plan and appears to understand importance of following up with orthopedist; he has a brother who speaks English who can call to schedule an appointment. Pt stable for discharge at this time.        Final Clinical Impressions(s) / ED Diagnoses   Final diagnoses:  Visit for wound check  Visit for suture removal    ED Discharge Orders         Ordered    sulfamethoxazole-trimethoprim (BACTRIM DS) 800-160 MG tablet  2 times daily     10/31/18 1542  Tanda Rockers, PA-C 11/01/18 1123    Long, Arlyss Repress, MD 11/06/18 [phone]

## 2018-11-06 ENCOUNTER — Other Ambulatory Visit (HOSPITAL_COMMUNITY)
Admission: RE | Admit: 2018-11-06 | Discharge: 2018-11-06 | Disposition: A | Discharge status: Home / Self Care | Payer: HRSA Program | Source: Ambulatory Visit | Attending: Orthopaedic Surgery | Admitting: Orthopaedic Surgery

## 2018-11-06 ENCOUNTER — Inpatient Hospital Stay (HOSPITAL_COMMUNITY): Admission: RE | Admit: 2018-11-06 | Payer: Self-pay | Source: Ambulatory Visit

## 2018-11-06 ENCOUNTER — Ambulatory Visit: Payer: Self-pay | Admitting: Orthopaedic Surgery

## 2018-11-06 ENCOUNTER — Other Ambulatory Visit: Payer: Self-pay

## 2018-11-06 ENCOUNTER — Ambulatory Visit (INDEPENDENT_AMBULATORY_CARE_PROVIDER_SITE_OTHER): Payer: Self-pay

## 2018-11-06 DIAGNOSIS — Z1159 Encounter for screening for other viral diseases: Secondary | ICD-10-CM | POA: Diagnosis present

## 2018-11-06 DIAGNOSIS — S93105A Unspecified dislocation of left toe(s), initial encounter: Secondary | ICD-10-CM

## 2018-11-06 DIAGNOSIS — S92502A Displaced unspecified fracture of left lesser toe(s), initial encounter for closed fracture: Secondary | ICD-10-CM

## 2018-11-06 LAB — SARS CORONAVIRUS 2 BY RT PCR (HOSPITAL ORDER, PERFORMED IN ~~LOC~~ HOSPITAL LAB): SARS Coronavirus 2: NEGATIVE

## 2018-11-06 MED ORDER — MUPIROCIN 2 % EX OINT: 1.0000 "application " | TOPICAL_OINTMENT | Freq: Two times a day (BID) | CUTANEOUS | 0 refills | Status: DC

## 2018-11-06 MED ORDER — CEPHALEXIN 500 MG PO CAPS: 500.0000 mg | ORAL_CAPSULE | Freq: Four times a day (QID) | ORAL | 0 refills | Status: DC

## 2018-11-06 NOTE — Progress Notes (Addendum)
Office Visit Note   Patient: Corey Anthony           Date of Birth: August 18, 1985           MRN: [id number] Visit Date: 11/06/2018              Requested by: Department, [facility] [address] [place], [place] [address] PCP: Department, [facility]   Assessment & Plan: Visit Diagnoses:  1. Closed fracture of phalanx of left third toe, initial encounter   2. Closed dislocation of fifth toe of left foot, initial encounter     Plan: Through the use of the interpreter I discussed with the patient that he still has a persistent open dislocation of his fifth toe without open wound therefore he will need to have this formally washed out and pinned in order to decrease risk of infection and restore toe function.  Given the fact that he has had an open dislocation of his left toe for the last 2 weeks since his injury he is at risk for deep infection and osteomyelitis which would then result in loss of all or part of his small toe.  We will also plan to pin the third toe fracture.  We will plan on doing this in the operating theater tomorrow.  All questions answered to his satisfaction.  Follow-Up Instructions: Return in about 2 weeks (around 11/20/2018).   Orders:  Orders Placed This Encounter  Procedures  . XR Foot Complete Left   Meds ordered this encounter  Medications  . mupirocin ointment (BACTROBAN) 2 %    Sig: Apply 1 application topically 2 (two) times daily.    Dispense:  22 g    Refill:  0  . cephALEXin (KEFLEX) 500 MG capsule    Sig: Take 1 capsule (500 mg total) by mouth 4 (four) times daily.    Dispense:  40 capsule    Refill:  0      Procedures: No procedures performed   Clinical Data: No additional findings.   Subjective: Chief Complaint  Patient presents with  . Left Foot - Injury    Patient is a 33 year old gentleman who comes in for evaluation of his left foot injury that he sustained about 2 weeks ago.  He comes in today with his friend  who is serving as his interpreter.  He is [place]ese.  He struck his left foot on some sort of metallic object and sustained a nondisplaced third toe fracture   Review of Systems  Constitutional: Negative.   All other systems reviewed and are negative.   Objective: Vital Signs: There were no vitals taken for this visit.  Physical Exam Vitals signs and nursing note reviewed.  Constitutional:      Appearance: He is well-developed.  HENT:     Head: Normocephalic and atraumatic.  Eyes:     Pupils: Pupils are equal, round, and reactive to light.  Neck:     Musculoskeletal: Neck supple.  Pulmonary:     Effort: Pulmonary effort is normal.  Abdominal:     Palpations: Abdomen is soft.  Musculoskeletal: Normal range of motion.  Skin:    General: Skin is warm.  Neurological:     Mental Status: He is alert and oriented to person, place, and time.  Psychiatric:        Behavior: Behavior normal.        Thought Content: Thought content normal.        Judgment: Judgment normal.  Ortho Exam Left foot exam shows moderate swelling of the forefoot and toes.  There is no neurovascular compromise. There is a traumatic wound in the webspace between the fourth and fifth toes.  There is no evidence of infection or drainage.  I am unable to see the distal portion of the proximal phalanx through the wound.  There is no neurovascular compromise to the toe. Specialty Comments:  No specialty comments available.  Imaging: Xr Foot Complete Left  Result Date: 11/06/2018 Persistent dislocation of left fifth toe.  Stable third toe fracture.    PMFS History: There are no active problems to display for this patient.  Past Medical History:  Diagnosis Date  . Asthma     No family history on file.  No past surgical history on file. Social History   Occupational History  . Not on file  Tobacco Use  . Smoking status: Never Smoker  . Smokeless tobacco: Never Used  Substance and Sexual Activity   . Alcohol use: Yes  . Drug use: No  . Sexual activity: Not on file

## 2018-11-07 ENCOUNTER — Other Ambulatory Visit: Payer: Self-pay

## 2018-11-07 ENCOUNTER — Encounter (HOSPITAL_BASED_OUTPATIENT_CLINIC_OR_DEPARTMENT_OTHER): Payer: Self-pay | Admitting: *Deleted

## 2018-11-07 ENCOUNTER — Encounter (HOSPITAL_BASED_OUTPATIENT_CLINIC_OR_DEPARTMENT_OTHER): Payer: Self-pay | Admitting: Anesthesiology

## 2018-11-07 ENCOUNTER — Encounter (HOSPITAL_COMMUNITY): Payer: Self-pay | Admitting: Anesthesiology

## 2018-11-07 ENCOUNTER — Ambulatory Visit (HOSPITAL_BASED_OUTPATIENT_CLINIC_OR_DEPARTMENT_OTHER)
Admission: RE | Admit: 2018-11-07 | Discharge: 2018-11-07 | Disposition: A | Discharge status: Home / Self Care | Payer: Self-pay | Attending: Orthopaedic Surgery | Admitting: Orthopaedic Surgery

## 2018-11-07 ENCOUNTER — Encounter (HOSPITAL_BASED_OUTPATIENT_CLINIC_OR_DEPARTMENT_OTHER)
Admission: RE | Disposition: A | Discharge status: Home / Self Care | Payer: Self-pay | Source: Home / Self Care | Attending: Orthopaedic Surgery

## 2018-11-07 DIAGNOSIS — S92912A Unspecified fracture of left toe(s), initial encounter for closed fracture: Secondary | ICD-10-CM | POA: Insufficient documentation

## 2018-11-07 DIAGNOSIS — X58XXXA Exposure to other specified factors, initial encounter: Secondary | ICD-10-CM | POA: Insufficient documentation

## 2018-11-07 DIAGNOSIS — Z538 Procedure and treatment not carried out for other reasons: Secondary | ICD-10-CM | POA: Insufficient documentation

## 2018-11-07 DIAGNOSIS — Z79899 Other long term (current) drug therapy: Secondary | ICD-10-CM | POA: Insufficient documentation

## 2018-11-07 DIAGNOSIS — S93105A Unspecified dislocation of left toe(s), initial encounter: Secondary | ICD-10-CM | POA: Insufficient documentation

## 2018-11-07 HISTORY — PX: ORIF TOE FRACTURE: SHX5032

## 2018-11-07 SURGERY — OPEN REDUCTION INTERNAL FIXATION (ORIF) METATARSAL (TOE) FRACTURE
Anesthesia: General | Laterality: Left

## 2018-11-07 MED ORDER — LACTATED RINGERS IV SOLN
INTRAVENOUS | Status: DC
  Administered 2018-11-07: 13:00:00 via INTRAVENOUS

## 2018-11-07 MED ORDER — FENTANYL CITRATE (PF) 100 MCG/2ML IJ SOLN: 50.0000 ug | INTRAMUSCULAR | Status: DC | PRN

## 2018-11-07 MED ORDER — MIDAZOLAM HCL 2 MG/2ML IJ SOLN: 1.0000 mg | INTRAMUSCULAR | Status: DC | PRN

## 2018-11-07 MED ORDER — LACTATED RINGERS IV SOLN: INTRAVENOUS | Status: DC

## 2018-11-07 MED ORDER — SCOPOLAMINE 1 MG/3DAYS TD PT72: 1.0000 | MEDICATED_PATCH | Freq: Once | TRANSDERMAL | Status: DC | PRN

## 2018-11-07 MED ORDER — CHLORHEXIDINE GLUCONATE 4 % EX LIQD: 60.0000 mL | Freq: Once | CUTANEOUS | Status: DC

## 2018-11-07 MED ORDER — CEFAZOLIN SODIUM-DEXTROSE 2-4 GM/100ML-% IV SOLN: 2.0000 g | INTRAVENOUS | Status: DC

## 2018-11-07 SURGICAL SUPPLY — 69 items
BANDAGE ACE 4X5 VEL STRL LF (GAUZE/BANDAGES/DRESSINGS) IMPLANT
BANDAGE ESMARK 6X9 LF (GAUZE/BANDAGES/DRESSINGS) ×1 IMPLANT
BLADE HEX COATED 2.75 (ELECTRODE) ×3 IMPLANT
BLADE SURG 15 STRL LF DISP TIS (BLADE) ×2 IMPLANT
BLADE SURG 15 STRL SS (BLADE) ×4
BNDG COHESIVE 4X5 TAN STRL (GAUZE/BANDAGES/DRESSINGS) ×3 IMPLANT
BNDG ESMARK 6X9 LF (GAUZE/BANDAGES/DRESSINGS) ×3
CANISTER SUCT 1200ML W/VALVE (MISCELLANEOUS) ×3 IMPLANT
COVER BACK TABLE REUSABLE LG (DRAPES) ×3 IMPLANT
COVER WAND RF STERILE (DRAPES) IMPLANT
CUFF TOURN SGL QUICK 34 (TOURNIQUET CUFF)
CUFF TRNQT CYL 34X4.125X (TOURNIQUET CUFF) IMPLANT
DECANTER SPIKE VIAL GLASS SM (MISCELLANEOUS) IMPLANT
DRAPE C-ARM 42X72 X-RAY (DRAPES) ×3 IMPLANT
DRAPE C-ARMOR (DRAPES) IMPLANT
DRAPE EXTREMITY T 121X128X90 (DISPOSABLE) ×3 IMPLANT
DRAPE IMP U-DRAPE 54X76 (DRAPES) ×3 IMPLANT
DRAPE OEC MINIVIEW 54X84 (DRAPES) ×3 IMPLANT
DRAPE SURG 17X23 STRL (DRAPES) ×6 IMPLANT
DRAPE U-SHAPE 47X51 STRL (DRAPES) ×3 IMPLANT
DRSG PAD ABDOMINAL 8X10 ST (GAUZE/BANDAGES/DRESSINGS) ×6 IMPLANT
DURAPREP 26ML APPLICATOR (WOUND CARE) ×3 IMPLANT
ELECT REM PT RETURN 9FT ADLT (ELECTROSURGICAL) ×3
ELECTRODE REM PT RTRN 9FT ADLT (ELECTROSURGICAL) ×1 IMPLANT
GAUZE SPONGE 4X4 12PLY STRL (GAUZE/BANDAGES/DRESSINGS) ×3 IMPLANT
GAUZE XEROFORM 1X8 LF (GAUZE/BANDAGES/DRESSINGS) ×3 IMPLANT
GLOVE BIOGEL PI IND STRL 7.0 (GLOVE) ×1 IMPLANT
GLOVE BIOGEL PI INDICATOR 7.0 (GLOVE) ×2
GLOVE ECLIPSE 7.0 STRL STRAW (GLOVE) ×3 IMPLANT
GLOVE SKINSENSE NS SZ7.5 (GLOVE) ×2
GLOVE SKINSENSE STRL SZ7.5 (GLOVE) ×1 IMPLANT
GLOVE SURG SYN 7.5  E (GLOVE) ×2
GLOVE SURG SYN 7.5 E (GLOVE) ×1 IMPLANT
GOWN STRL REIN XL XLG (GOWN DISPOSABLE) ×3 IMPLANT
GOWN STRL REUS W/ TWL LRG LVL3 (GOWN DISPOSABLE) ×1 IMPLANT
GOWN STRL REUS W/ TWL XL LVL3 (GOWN DISPOSABLE) ×1 IMPLANT
GOWN STRL REUS W/TWL LRG LVL3 (GOWN DISPOSABLE) ×2
GOWN STRL REUS W/TWL XL LVL3 (GOWN DISPOSABLE) ×2
NEEDLE HYPO 22GX1.5 SAFETY (NEEDLE) IMPLANT
NS IRRIG 1000ML POUR BTL (IV SOLUTION) ×3 IMPLANT
PACK BASIN DAY SURGERY FS (CUSTOM PROCEDURE TRAY) ×3 IMPLANT
PAD CAST 3X4 CTTN HI CHSV (CAST SUPPLIES) IMPLANT
PAD CAST 4YDX4 CTTN HI CHSV (CAST SUPPLIES) IMPLANT
PADDING CAST COTTON 3X4 STRL (CAST SUPPLIES)
PADDING CAST COTTON 4X4 STRL (CAST SUPPLIES)
PADDING CAST SYN 6 (CAST SUPPLIES)
PADDING CAST SYNTHETIC 4 (CAST SUPPLIES)
PADDING CAST SYNTHETIC 4X4 STR (CAST SUPPLIES) IMPLANT
PADDING CAST SYNTHETIC 6X4 NS (CAST SUPPLIES) IMPLANT
PENCIL BUTTON HOLSTER BLD 10FT (ELECTRODE) ×3 IMPLANT
SHEET MEDIUM DRAPE 40X70 STRL (DRAPES) ×3 IMPLANT
SLEEVE SCD COMPRESS KNEE MED (MISCELLANEOUS) IMPLANT
SPLINT FIBERGLASS 3X35 (CAST SUPPLIES) IMPLANT
SPLINT FIBERGLASS 4X30 (CAST SUPPLIES) IMPLANT
SPONGE LAP 18X18 RF (DISPOSABLE) ×3 IMPLANT
SUCTION FRAZIER HANDLE 10FR (MISCELLANEOUS) ×2
SUCTION TUBE FRAZIER 10FR DISP (MISCELLANEOUS) ×1 IMPLANT
SUT ETHILON 3 0 PS 1 (SUTURE) IMPLANT
SUT VIC AB 0 CT1 27 (SUTURE)
SUT VIC AB 0 CT1 27XBRD ANBCTR (SUTURE) IMPLANT
SUT VIC AB 2-0 CT1 27 (SUTURE)
SUT VIC AB 2-0 CT1 TAPERPNT 27 (SUTURE) IMPLANT
SYR BULB 3OZ (MISCELLANEOUS) ×3 IMPLANT
SYR CONTROL 10ML LL (SYRINGE) IMPLANT
TOWEL GREEN STERILE FF (TOWEL DISPOSABLE) ×3 IMPLANT
TUBE CONNECTING 20'X1/4 (TUBING) ×1
TUBE CONNECTING 20X1/4 (TUBING) ×2 IMPLANT
UNDERPAD 30X30 (UNDERPADS AND DIAPERS) ×3 IMPLANT
YANKAUER SUCT BULB TIP NO VENT (SUCTIONS) ×3 IMPLANT

## 2018-11-07 NOTE — H&P (Signed)
PREOPERATIVE H&P  Chief Complaint: left fifth toe dislocation, left 3rd toe fracture  HPI: Corey Anthony is a 33 y.o. male who presents for surgical treatment of left fifth toe dislocation, left 3rd toe fracture.  He denies any changes in medical history.  Past Medical History:  Diagnosis Date  . Asthma    No past surgical history on file. Social History   Socioeconomic History  . Marital status: Single    Spouse name: Not on file  . Number of children: Not on file  . Years of education: Not on file  . Highest education level: Not on file  Occupational History  . Not on file  Social Needs  . Financial resource strain: Not on file  . Food insecurity:    Worry: Not on file    Inability: Not on file  . Transportation needs:    Medical: Not on file    Non-medical: Not on file  Tobacco Use  . Smoking status: Never Smoker  . Smokeless tobacco: Never Used  Substance and Sexual Activity  . Alcohol use: Yes  . Drug use: No  . Sexual activity: Not on file  Lifestyle  . Physical activity:    Days per week: Not on file    Minutes per session: Not on file  . Stress: Not on file  Relationships  . Social connections:    Talks on phone: Not on file    Gets together: Not on file    Attends religious service: Not on file    Active member of club or organization: Not on file    Attends meetings of clubs or organizations: Not on file    Relationship status: Not on file  Other Topics Concern  . Not on file  Social History Narrative  . Not on file   No family history on file. No Known Allergies Prior to Admission medications   Medication Sig Start Date End Date Taking? Authorizing Provider  cephALEXin (KEFLEX) 500 MG capsule Take 1 capsule (500 mg total) by mouth 4 (four) times daily. 11/06/18   Tarry Kos, MD  cetirizine (ZYRTEC) 10 MG tablet Take 1 tablet (10 mg total) by mouth daily. 09/11/18   Cathie Hoops, Amy V, PA-C  HYDROcodone-acetaminophen (NORCO/VICODIN) 5-325 MG tablet Take  1-2 tablets by mouth every 6 (six) hours as needed. 10/20/18   Aviva Kluver B, PA-C  mupirocin ointment (BACTROBAN) 2 % Apply 1 application topically 2 (two) times daily. 11/06/18   Tarry Kos, MD  Olopatadine HCl 0.2 % SOLN Apply 1 drop to eye daily. 09/11/18   Cathie Hoops, Amy V, PA-C  OVER THE COUNTER MEDICATION "nose spray"    [provider]  sulfamethoxazole-trimethoprim (BACTRIM DS) 800-160 MG tablet Take 1 tablet by mouth 2 (two) times daily for 7 days. 10/31/18 11/07/18  Venter, Margaux, PA-C     Positive ROS: All other systems have been reviewed and were otherwise negative with the exception of those mentioned in the HPI and as above.  Physical Exam: General: Alert, no acute distress Cardiovascular: No pedal edema Respiratory: No cyanosis, no use of accessory musculature GI: abdomen soft Skin: No lesions in the area of chief complaint Neurologic: Sensation intact distally Psychiatric: Patient is competent for consent with normal mood and affect Lymphatic: no lymphedema  MUSCULOSKELETAL: exam stable  Assessment: left fifth toe dislocation, left 3rd toe fracture  Plan: Plan for Procedure(s): OPEN REDUCTION PERCUTANEOUS PINNING LEFT 3RD TOE PROXIMAL PHALANX, IRRIGATION AND DEBRIDEMENT LEFT 5TH TOE DISLOCATION  The risks benefits and alternatives were discussed with the patient including but not limited to the risks of nonoperative treatment, versus surgical intervention including infection, bleeding, nerve injury,  blood clots, cardiopulmonary complications, morbidity, mortality, among others, and they were willing to proceed.   Glee ArvinMichael Tresea Heine, MD   11/07/2018 8:14 AM

## 2018-11-07 NOTE — Anesthesia Preprocedure Evaluation (Deleted)
Anesthesia Evaluation    Reviewed: Allergy & Precautions, Patient's Chart, lab work & pertinent test results  Airway        Dental   Pulmonary asthma ,           Cardiovascular negative cardio ROS       Neuro/Psych negative neurological ROS     GI/Hepatic negative GI ROS, Neg liver ROS,   Endo/Other  negative endocrine ROS  Renal/GU negative Renal ROS     Musculoskeletal Left foot fracture   Abdominal   Peds  Hematology negative hematology ROS (+)   Anesthesia Other Findings Day of surgery medications reviewed with the patient.  Reproductive/Obstetrics                             Anesthesia Physical Anesthesia Plan  ASA: II  Anesthesia Plan: Regional and MAC   Post-op Pain Management:    Induction:   PONV Risk Score and Plan: Treatment may vary due to age or medical condition, Ondansetron, Propofol infusion and Midazolam  Airway Management Planned: Natural Airway and Simple Face Mask  Additional Equipment:   Intra-op Plan:   Post-operative Plan:   Informed Consent:   Plan Discussed with:   Anesthesia Plan Comments:         Anesthesia Quick Evaluation

## 2018-11-07 NOTE — Progress Notes (Signed)
Surgery will be rescheduled due to patient eating lunch today.

## 2018-11-07 NOTE — Progress Notes (Signed)
Left voice message with pre-op instructions only- after failed attempts to reach pt. Please complete assessment on DOS.  Pre-op instructions were left on pt phone using Bur[place]ese interpreter, HTAE # [id number]. Pt made aware to not eat after midnight tonight and okay to drink water from 12 midnight until 4:30 A.M.; if a pre-surgery carbohydrate drink was provided, please drink that between 4:00A.M and 4:30 A.M.. Do not drink anything after 4:30 A.M. Pt made aware to stop taking  Aspirin (unless otherwise advised by surgeon), or vitamins, fish oil and herbal medications.

## 2018-11-08 ENCOUNTER — Encounter (HOSPITAL_COMMUNITY): Admission: RE | Payer: Self-pay | Source: Home / Self Care

## 2018-11-08 ENCOUNTER — Inpatient Hospital Stay (HOSPITAL_COMMUNITY): Admission: RE | Admit: 2018-11-08 | Payer: Self-pay | Source: Home / Self Care | Admitting: Orthopaedic Surgery

## 2018-11-08 ENCOUNTER — Encounter (HOSPITAL_COMMUNITY): Payer: Self-pay | Admitting: Anesthesiology

## 2018-11-08 SURGERY — PINNING, EXTREMITY, PERCUTANEOUS
Anesthesia: Choice | Laterality: Left

## 2018-11-08 MED ORDER — PROPOFOL 10 MG/ML IV BOLUS
INTRAVENOUS | Status: AC
  Filled 2018-11-08: qty 20

## 2018-11-08 MED ORDER — MIDAZOLAM HCL 2 MG/2ML IJ SOLN
INTRAMUSCULAR | Status: AC
  Filled 2018-11-08: qty 2

## 2018-11-08 MED ORDER — FENTANYL CITRATE (PF) 250 MCG/5ML IJ SOLN
INTRAMUSCULAR | Status: AC
  Filled 2018-11-08: qty 5

## 2018-11-11 ENCOUNTER — Other Ambulatory Visit: Payer: Self-pay

## 2018-11-11 ENCOUNTER — Encounter (HOSPITAL_BASED_OUTPATIENT_CLINIC_OR_DEPARTMENT_OTHER): Payer: Self-pay | Admitting: Certified Registered"

## 2018-11-11 ENCOUNTER — Encounter (HOSPITAL_BASED_OUTPATIENT_CLINIC_OR_DEPARTMENT_OTHER): Payer: Self-pay | Admitting: *Deleted

## 2018-11-12 ENCOUNTER — Other Ambulatory Visit (HOSPITAL_COMMUNITY)
Admission: RE | Admit: 2018-11-12 | Discharge: 2018-11-12 | Disposition: A | Discharge status: Home / Self Care | Payer: HRSA Program | Source: Ambulatory Visit | Attending: Orthopaedic Surgery | Admitting: Orthopaedic Surgery

## 2018-11-12 ENCOUNTER — Encounter (HOSPITAL_BASED_OUTPATIENT_CLINIC_OR_DEPARTMENT_OTHER): Payer: Self-pay | Admitting: Orthopaedic Surgery

## 2018-11-12 DIAGNOSIS — Z1159 Encounter for screening for other viral diseases: Secondary | ICD-10-CM | POA: Insufficient documentation

## 2018-11-12 NOTE — Progress Notes (Signed)
Pre op phone call competed with pt's brother. Instruction reviewed in great detail. Per [facility] Nursing Director pt will need to repeat covid screen, which was negative on 5/13. PT may go either 5/19 or 5/20 for rapid screen. Brother Lina Sayre was able to verbalize understanding regarding screening and pre op arrival time, NPO, address etc. [place]ese interpreter requested for DOS with Jessica at [email or web address].

## 2018-11-13 LAB — NOVEL CORONAVIRUS, NAA (HOSP ORDER, SEND-OUT TO REF LAB; TAT 18-24 HRS): SARS-CoV-2, NAA: NOT DETECTED

## 2018-11-14 ENCOUNTER — Encounter (HOSPITAL_BASED_OUTPATIENT_CLINIC_OR_DEPARTMENT_OTHER)
Admission: RE | Disposition: A | Discharge status: Home / Self Care | Payer: Self-pay | Source: Home / Self Care | Attending: Orthopaedic Surgery

## 2018-11-14 ENCOUNTER — Ambulatory Visit (HOSPITAL_BASED_OUTPATIENT_CLINIC_OR_DEPARTMENT_OTHER): Payer: Self-pay | Admitting: Anesthesiology

## 2018-11-14 ENCOUNTER — Ambulatory Visit (HOSPITAL_BASED_OUTPATIENT_CLINIC_OR_DEPARTMENT_OTHER)
Admission: RE | Admit: 2018-11-14 | Discharge: 2018-11-14 | Disposition: A | Discharge status: Home / Self Care | Payer: Self-pay | Attending: Orthopaedic Surgery | Admitting: Orthopaedic Surgery

## 2018-11-14 ENCOUNTER — Encounter (HOSPITAL_BASED_OUTPATIENT_CLINIC_OR_DEPARTMENT_OTHER): Payer: Self-pay | Admitting: *Deleted

## 2018-11-14 DIAGNOSIS — S92502A Displaced unspecified fracture of left lesser toe(s), initial encounter for closed fracture: Secondary | ICD-10-CM

## 2018-11-14 DIAGNOSIS — S93105A Unspecified dislocation of left toe(s), initial encounter: Secondary | ICD-10-CM | POA: Insufficient documentation

## 2018-11-14 DIAGNOSIS — S91105A Unspecified open wound of left lesser toe(s) without damage to nail, initial encounter: Secondary | ICD-10-CM

## 2018-11-14 DIAGNOSIS — X58XXXA Exposure to other specified factors, initial encounter: Secondary | ICD-10-CM | POA: Insufficient documentation

## 2018-11-14 DIAGNOSIS — S92515A Nondisplaced fracture of proximal phalanx of left lesser toe(s), initial encounter for closed fracture: Secondary | ICD-10-CM | POA: Insufficient documentation

## 2018-11-14 DIAGNOSIS — J45909 Unspecified asthma, uncomplicated: Secondary | ICD-10-CM | POA: Insufficient documentation

## 2018-11-14 HISTORY — PX: CLOSED REDUCTION METATARSAL: SHX5774

## 2018-11-14 SURGERY — CLOSED REDUCTION, FRACTURE, METATARSAL BONE
Anesthesia: Monitor Anesthesia Care | Site: Foot | Laterality: Left

## 2018-11-14 MED ORDER — MIDAZOLAM HCL 2 MG/2ML IJ SOLN
1.0000 mg | INTRAMUSCULAR | Status: DC | PRN
  Administered 2018-11-14: 2 mg via INTRAVENOUS

## 2018-11-14 MED ORDER — FENTANYL CITRATE (PF) 100 MCG/2ML IJ SOLN
INTRAMUSCULAR | Status: AC
  Filled 2018-11-14: qty 2

## 2018-11-14 MED ORDER — ONDANSETRON HCL 4 MG PO TABS: 4.0000 mg | ORAL_TABLET | Freq: Three times a day (TID) | ORAL | 0 refills | Status: DC | PRN

## 2018-11-14 MED ORDER — MUPIROCIN 2 % EX OINT
TOPICAL_OINTMENT | CUTANEOUS | Status: AC
  Filled 2018-11-14: qty 22

## 2018-11-14 MED ORDER — PROPOFOL 500 MG/50ML IV EMUL
INTRAVENOUS | Status: AC
  Filled 2018-11-14: qty 50

## 2018-11-14 MED ORDER — OXYCODONE-ACETAMINOPHEN 5-325 MG PO TABS: 1.0000 | ORAL_TABLET | Freq: Four times a day (QID) | ORAL | 0 refills | Status: DC | PRN

## 2018-11-14 MED ORDER — FENTANYL CITRATE (PF) 100 MCG/2ML IJ SOLN
50.0000 ug | INTRAMUSCULAR | Status: DC | PRN
  Administered 2018-11-14: 13:00:00 100 ug via INTRAVENOUS

## 2018-11-14 MED ORDER — ONDANSETRON HCL 4 MG/2ML IJ SOLN
INTRAMUSCULAR | Status: DC | PRN
  Administered 2018-11-14: 4 mg via INTRAVENOUS

## 2018-11-14 MED ORDER — LIDOCAINE 2% (20 MG/ML) 5 ML SYRINGE
INTRAMUSCULAR | Status: AC
  Filled 2018-11-14: qty 5

## 2018-11-14 MED ORDER — DEXAMETHASONE SODIUM PHOSPHATE 4 MG/ML IJ SOLN
INTRAMUSCULAR | Status: DC | PRN
  Administered 2018-11-14: 5 mg via INTRAVENOUS

## 2018-11-14 MED ORDER — CHLORHEXIDINE GLUCONATE 4 % EX LIQD: 60.0000 mL | Freq: Once | CUTANEOUS | Status: DC

## 2018-11-14 MED ORDER — FENTANYL CITRATE (PF) 100 MCG/2ML IJ SOLN: 25.0000 ug | INTRAMUSCULAR | Status: DC | PRN

## 2018-11-14 MED ORDER — PROPOFOL 500 MG/50ML IV EMUL
INTRAVENOUS | Status: DC | PRN
  Administered 2018-11-14: 200 ug/kg/min via INTRAVENOUS

## 2018-11-14 MED ORDER — DEXAMETHASONE SODIUM PHOSPHATE 10 MG/ML IJ SOLN
INTRAMUSCULAR | Status: AC
  Filled 2018-11-14: qty 1

## 2018-11-14 MED ORDER — MEPERIDINE HCL 25 MG/ML IJ SOLN: 6.2500 mg | INTRAMUSCULAR | Status: DC | PRN

## 2018-11-14 MED ORDER — CEFAZOLIN SODIUM-DEXTROSE 2-4 GM/100ML-% IV SOLN
INTRAVENOUS | Status: AC
  Filled 2018-11-14: qty 100

## 2018-11-14 MED ORDER — SCOPOLAMINE 1 MG/3DAYS TD PT72: 1.0000 | MEDICATED_PATCH | Freq: Once | TRANSDERMAL | Status: DC | PRN

## 2018-11-14 MED ORDER — ROPIVACAINE HCL 5 MG/ML IJ SOLN
INTRAMUSCULAR | Status: DC | PRN
  Administered 2018-11-14: 25 mL via PERINEURAL

## 2018-11-14 MED ORDER — HYDROCODONE-ACETAMINOPHEN 7.5-325 MG PO TABS: 1.0000 | ORAL_TABLET | Freq: Once | ORAL | Status: DC | PRN

## 2018-11-14 MED ORDER — MIDAZOLAM HCL 2 MG/2ML IJ SOLN
INTRAMUSCULAR | Status: AC
  Filled 2018-11-14: qty 2

## 2018-11-14 MED ORDER — PROPOFOL 10 MG/ML IV BOLUS
INTRAVENOUS | Status: DC | PRN
  Administered 2018-11-14 (×3): 20 mg via INTRAVENOUS

## 2018-11-14 MED ORDER — LACTATED RINGERS IV SOLN
INTRAVENOUS | Status: DC
  Administered 2018-11-14: 12:00:00 via INTRAVENOUS

## 2018-11-14 MED ORDER — CEFAZOLIN SODIUM-DEXTROSE 2-4 GM/100ML-% IV SOLN
2.0000 g | INTRAVENOUS | Status: AC
  Administered 2018-11-14: 14:00:00 2 g via INTRAVENOUS

## 2018-11-14 MED ORDER — LIDOCAINE 2% (20 MG/ML) 5 ML SYRINGE
INTRAMUSCULAR | Status: DC | PRN
  Administered 2018-11-14: 60 mg via INTRAVENOUS

## 2018-11-14 MED ORDER — LACTATED RINGERS IV SOLN: INTRAVENOUS | Status: DC

## 2018-11-14 MED ORDER — ONDANSETRON HCL 4 MG/2ML IJ SOLN
INTRAMUSCULAR | Status: AC
  Filled 2018-11-14: qty 2

## 2018-11-14 MED ORDER — ONDANSETRON HCL 4 MG/2ML IJ SOLN: 4.0000 mg | Freq: Once | INTRAMUSCULAR | Status: DC | PRN

## 2018-11-14 SURGICAL SUPPLY — 72 items
BANDAGE ACE 4X5 VEL STRL LF (GAUZE/BANDAGES/DRESSINGS) IMPLANT
BANDAGE ESMARK 6X9 LF (GAUZE/BANDAGES/DRESSINGS) ×2 IMPLANT
BLADE HEX COATED 2.75 (ELECTRODE) ×4 IMPLANT
BLADE SURG 15 STRL LF DISP TIS (BLADE) ×4 IMPLANT
BLADE SURG 15 STRL SS (BLADE) ×4
BNDG COHESIVE 4X5 TAN STRL (GAUZE/BANDAGES/DRESSINGS) ×8 IMPLANT
BNDG ESMARK 6X9 LF (GAUZE/BANDAGES/DRESSINGS) ×4
CANISTER SUCT 1200ML W/VALVE (MISCELLANEOUS) ×4 IMPLANT
COVER BACK TABLE REUSABLE LG (DRAPES) ×4 IMPLANT
COVER WAND RF STERILE (DRAPES) IMPLANT
CUFF TOURN SGL QUICK 34 (TOURNIQUET CUFF)
CUFF TRNQT CYL 34X4.125X (TOURNIQUET CUFF) IMPLANT
DECANTER SPIKE VIAL GLASS SM (MISCELLANEOUS) IMPLANT
DRAPE C-ARM 42X72 X-RAY (DRAPES) IMPLANT
DRAPE C-ARMOR (DRAPES) IMPLANT
DRAPE EXTREMITY T 121X128X90 (DISPOSABLE) ×4 IMPLANT
DRAPE IMP U-DRAPE 54X76 (DRAPES) ×4 IMPLANT
DRAPE OEC MINIVIEW 54X84 (DRAPES) ×4 IMPLANT
DRAPE SURG 17X23 STRL (DRAPES) ×8 IMPLANT
DRAPE U-SHAPE 47X51 STRL (DRAPES) ×4 IMPLANT
DRSG PAD ABDOMINAL 8X10 ST (GAUZE/BANDAGES/DRESSINGS) ×8 IMPLANT
DURAPREP 26ML APPLICATOR (WOUND CARE) ×4 IMPLANT
ELECT REM PT RETURN 9FT ADLT (ELECTROSURGICAL) ×4
ELECTRODE REM PT RTRN 9FT ADLT (ELECTROSURGICAL) ×2 IMPLANT
GAUZE SPONGE 4X4 12PLY STRL (GAUZE/BANDAGES/DRESSINGS) ×4 IMPLANT
GAUZE XEROFORM 1X8 LF (GAUZE/BANDAGES/DRESSINGS) ×4 IMPLANT
GLOVE BIOGEL PI IND STRL 7.0 (GLOVE) ×4 IMPLANT
GLOVE BIOGEL PI IND STRL 8 (GLOVE) ×2 IMPLANT
GLOVE BIOGEL PI INDICATOR 7.0 (GLOVE) ×4
GLOVE BIOGEL PI INDICATOR 8 (GLOVE) ×2
GLOVE ECLIPSE 7.0 STRL STRAW (GLOVE) ×4 IMPLANT
GLOVE SKINSENSE NS SZ7.5 (GLOVE) ×2
GLOVE SKINSENSE STRL SZ7.5 (GLOVE) ×2 IMPLANT
GLOVE SURG SS PI 8.0 STRL IVOR (GLOVE) ×4 IMPLANT
GLOVE SURG SYN 7.5  E (GLOVE) ×2
GLOVE SURG SYN 7.5 E (GLOVE) ×2 IMPLANT
GOWN STRL REIN XL XLG (GOWN DISPOSABLE) ×4 IMPLANT
GOWN STRL REUS W/ TWL LRG LVL3 (GOWN DISPOSABLE) ×4 IMPLANT
GOWN STRL REUS W/ TWL XL LVL3 (GOWN DISPOSABLE) ×2 IMPLANT
GOWN STRL REUS W/TWL LRG LVL3 (GOWN DISPOSABLE) ×4
GOWN STRL REUS W/TWL XL LVL3 (GOWN DISPOSABLE) ×2
NEEDLE HYPO 22GX1.5 SAFETY (NEEDLE) IMPLANT
NS IRRIG 1000ML POUR BTL (IV SOLUTION) ×4 IMPLANT
PACK BASIN DAY SURGERY FS (CUSTOM PROCEDURE TRAY) ×4 IMPLANT
PAD CAST 3X4 CTTN HI CHSV (CAST SUPPLIES) IMPLANT
PAD CAST 4YDX4 CTTN HI CHSV (CAST SUPPLIES) IMPLANT
PADDING CAST COTTON 3X4 STRL (CAST SUPPLIES)
PADDING CAST COTTON 4X4 STRL (CAST SUPPLIES)
PADDING CAST SYN 6 (CAST SUPPLIES)
PADDING CAST SYNTHETIC 4 (CAST SUPPLIES)
PADDING CAST SYNTHETIC 4X4 STR (CAST SUPPLIES) IMPLANT
PADDING CAST SYNTHETIC 6X4 NS (CAST SUPPLIES) IMPLANT
PENCIL BUTTON HOLSTER BLD 10FT (ELECTRODE) ×4 IMPLANT
SHEET MEDIUM DRAPE 40X70 STRL (DRAPES) ×4 IMPLANT
SLEEVE SCD COMPRESS KNEE MED (MISCELLANEOUS) IMPLANT
SPLINT FIBERGLASS 3X35 (CAST SUPPLIES) IMPLANT
SPLINT FIBERGLASS 4X30 (CAST SUPPLIES) IMPLANT
SPONGE LAP 18X18 RF (DISPOSABLE) ×4 IMPLANT
SUCTION FRAZIER HANDLE 10FR (MISCELLANEOUS) ×2
SUCTION TUBE FRAZIER 10FR DISP (MISCELLANEOUS) ×2 IMPLANT
SUT ETHILON 3 0 PS 1 (SUTURE) ×4 IMPLANT
SUT VIC AB 0 CT1 27 (SUTURE)
SUT VIC AB 0 CT1 27XBRD ANBCTR (SUTURE) IMPLANT
SUT VIC AB 2-0 CT1 27 (SUTURE) ×2
SUT VIC AB 2-0 CT1 TAPERPNT 27 (SUTURE) ×2 IMPLANT
SYR BULB 3OZ (MISCELLANEOUS) ×4 IMPLANT
SYR CONTROL 10ML LL (SYRINGE) IMPLANT
TOWEL GREEN STERILE FF (TOWEL DISPOSABLE) ×4 IMPLANT
TUBE CONNECTING 20'X1/4 (TUBING) ×1
TUBE CONNECTING 20X1/4 (TUBING) ×3 IMPLANT
UNDERPAD 30X30 (UNDERPADS AND DIAPERS) ×4 IMPLANT
YANKAUER SUCT BULB TIP NO VENT (SUCTIONS) ×4 IMPLANT

## 2018-11-14 NOTE — Op Note (Signed)
   Date of Surgery: 11/14/2018  INDICATIONS: Corey Anthony is a 33 y.o.-year-old male with an open dislocation of the left small toe as well as a nondisplaced fracture of the proximal phalanx of the left third toe.  This happened approximately a month ago but due to various reasons the patient was not able to have surgery until today.  The patient did consent to the procedure after discussion of the risks and benefits.  PREOPERATIVE DIAGNOSIS:  1.  Left open dislocation of left small toe of PIP joint 2.  Left closed nondisplaced third toe proximal phalanx fracture  POSTOPERATIVE DIAGNOSIS: Same.  PROCEDURE:  1.  Irrigation and debridement of left small toe open dislocation including skin, subcutaneous tissue, periosteum 2.  Open reduction internal fixation of left small toe open dislocation with K wire 3.  Closed treatment of left third toe proximal phalanx fracture  SURGEON: N. Glee Arvin, M.D.  ASSIST: Starlyn Skeans [place], [place]; necessary for the timely completion of procedure and due to complexity of procedure.  ANESTHESIA: Regional block  IV FLUIDS AND URINE: See anesthesia.  ESTIMATED BLOOD LOSS: Minimal mL.  IMPLANTS: 0.045 K wire  DRAINS: None  COMPLICATIONS: None.  DESCRIPTION OF PROCEDURE: The patient was brought to the operating room and placed supine on the operating table.  The patient had been signed prior to the procedure and this was documented. The patient had the anesthesia placed by the anesthesiologist.  A time-out was performed to confirm that this was the correct patient, site, side and location. The patient did receive antibiotics prior to the incision and was re-dosed during the procedure as needed at indicated intervals. The patient had the operative extremity prepped and draped in the standard surgical fashion.    An Esmarch bandage was used as a calf tourniquet.  I first began with debridement of the open dislocation.  The traumatic skin was excised back to  healthy border with punctate bleeding with a 15 blade.  Blunt dissection was carried down to the open PIP joint dislocation.  There is no evidence of gross contamination or infection.  There was no chondral injury.  Thorough sharp excisional debridement of the skin, subcutaneous tissue, periosteum was performed using a rondure.  This was then thoroughly irrigated with 3 L of normal saline.  The dislocation was then reduced and confirmed under fluoroscopy.  A single K wire was then advanced in a retrograde fashion down the axis of the toe using fluoroscopic guidance.  Fortunately we were able to reduce the toe back into alignment.  The traumatic skin was then loosely approximated with 4-0 nylon.  The rest of the wound was left open to allow healing by secondary intention.  This was covered with Bactroban ointment.  We then took an x-ray of the third toe and the fracture remained nondisplaced.  Given the chronicity of the fracture I did not feel that this needed internal fixation.  Sterile dressings were then applied.  The pin was cut short and bent.  Tourniquet was deflated.  Patient tolerated procedure well had no immediate complications.  POSTOPERATIVE PLAN: Patient will need to be nonweightbearing to the left lower extremity.  He is to elevate at all times for swelling.  He will follow-up in the office in 1 week.  Corey Reel, MD [facility] [phone] 4:21 PM

## 2018-11-14 NOTE — Anesthesia Procedure Notes (Signed)
Procedure Name: MAC Performed by: Terrance Mass, CRNA Pre-anesthesia Checklist: Patient identified, Emergency Drugs available, Suction available, Patient being monitored and Timeout performed Comments: Nasal canula

## 2018-11-14 NOTE — H&P (Signed)
PREOPERATIVE H&P  Chief Complaint: left 5th toe dislocation, left 3rd toe fracture  HPI: Corey Anthony is a 33 y.o. male who presents for surgical treatment of left 5th toe dislocation, left 3rd toe fracture.  He denies any changes in medical history.  Past Medical History:  Diagnosis Date  . Asthma    Past Surgical History:  Procedure Laterality Date  . ORIF TOE FRACTURE Left 11/07/2018   Procedure: OPEN REDUCTION PERCUTANEOUS PINNING LEFT 3RD TOE PROXIMAL PHALANX, IRRIGATION AND DEBRIDEMENT LEFT 5TH TOE DISLOCATION;  Surgeon: Tarry KosXu, Marijah Larranaga M, MD;  Location: [facility];  Service: Orthopedics;  Laterality: Left;   Social History   Socioeconomic History  . Marital status: Single    Spouse name: Not on file  . Number of children: Not on file  . Years of education: Not on file  . Highest education level: Not on file  Occupational History  . Not on file  Social Needs  . Financial resource strain: Not on file  . Food insecurity:    Worry: Not on file    Inability: Not on file  . Transportation needs:    Medical: Not on file    Non-medical: Not on file  Tobacco Use  . Smoking status: Never Smoker  . Smokeless tobacco: Never Used  Substance and Sexual Activity  . Alcohol use: Yes  . Drug use: No  . Sexual activity: Not on file  Lifestyle  . Physical activity:    Days per week: Not on file    Minutes per session: Not on file  . Stress: Not on file  Relationships  . Social connections:    Talks on phone: Not on file    Gets together: Not on file    Attends religious service: Not on file    Active member of club or organization: Not on file    Attends meetings of clubs or organizations: Not on file    Relationship status: Not on file  Other Topics Concern  . Not on file  Social History Narrative  . Not on file   History reviewed. No pertinent family history. No Known Allergies Prior to Admission medications   Medication Sig Start Date End Date Taking?  Authorizing Provider  cephALEXin (KEFLEX) 500 MG capsule Take 1 capsule (500 mg total) by mouth 4 (four) times daily. 11/06/18  Yes Tarry KosXu, Tsuyako Jolley M, MD  cetirizine (ZYRTEC) 10 MG tablet Take 1 tablet (10 mg total) by mouth daily. 09/11/18  Yes Yu, Amy V, PA-C  HYDROcodone-acetaminophen (NORCO/VICODIN) 5-325 MG tablet Take 1-2 tablets by mouth every 6 (six) hours as needed. 10/20/18  Yes Dayton ScrapeMurray, Alyssa B, PA-C  mupirocin ointment (BACTROBAN) 2 % Apply 1 application topically 2 (two) times daily. 11/06/18   Tarry KosXu, Callie Bunyard M, MD  Olopatadine HCl 0.2 % SOLN Apply 1 drop to eye daily. 09/11/18   Cathie HoopsYu, Amy V, PA-C  OVER THE COUNTER MEDICATION "nose spray"    [provider]     Positive ROS: All other systems have been reviewed and were otherwise negative with the exception of those mentioned in the HPI and as above.  Physical Exam: General: Alert, no acute distress Cardiovascular: No pedal edema Respiratory: No cyanosis, no use of accessory musculature GI: abdomen soft Skin: No lesions in the area of chief complaint Neurologic: Sensation intact distally Psychiatric: Patient is competent for consent with normal mood and affect Lymphatic: no lymphedema  MUSCULOSKELETAL: exam stable  Assessment: left 5th toe dislocation, left 3rd  toe fracture  Plan: Plan for Procedure(s): OPEN REDUCTION PERCUTANEOUS PINNING LEFT 3RD TOE PROXIMAL PHALANX, IRRIGATION AND DEBRIDEMENT LEFT 5TH TOE DISLOCATION  The risks benefits and alternatives were discussed with the patient including but not limited to the risks of nonoperative treatment, versus surgical intervention including infection, bleeding, nerve injury,  blood clots, cardiopulmonary complications, morbidity, mortality, among others, and they were willing to proceed.   Glee Arvin, MD   11/14/2018 1:09 PM

## 2018-11-14 NOTE — Transfer of Care (Signed)
Immediate Anesthesia Transfer of Care Note  Patient: Corey Anthony  Procedure(s) Performed: Procedure name not found.  Patient Location: PACU  Anesthesia Type:MAC and Regional  Level of Consciousness: awake and sedated  Airway & Oxygen Therapy: Patient Spontanous Breathing and Patient connected to nasal cannula oxygen  Post-op Assessment: Report given to RN and Post -op Vital signs reviewed and stable  Post vital signs: Reviewed and stable  Last Vitals:  Vitals Value Taken Time  BP 124/91 11/14/2018  2:45 PM  Temp    Pulse 58 11/14/2018  2:45 PM  Resp 13 11/14/2018  2:45 PM  SpO2 100 % 11/14/2018  2:45 PM  Vitals shown include unvalidated device data.  Last Pain:  Vitals:   11/14/18 1134  TempSrc: Oral  PainSc: 8          Complications: No apparent anesthesia complications

## 2018-11-14 NOTE — Progress Notes (Signed)
Assisted Dr. Malen Gauze with left, ultrasound guided, ankle block. Side rails up, monitors on throughout procedure. See vital signs in flow sheet. Tolerated Procedure well.

## 2018-11-14 NOTE — Discharge Instructions (Signed)
Post Anesthesia Home Care Instructions  Activity: Get plenty of rest for the remainder of the day. A responsible individual must stay with you for 24 hours following the procedure.  For the next 24 hours, DO NOT: -Drive a car -Advertising copywriterperate machinery -Drink alcoholic beverages -Take any medication unless instructed by your physician -Make any legal decisions or sign important papers.  Meals: Start with liquid foods such as gelatin or soup. Progress to regular foods as tolerated. Avoid greasy, spicy, heavy foods. If nausea and/or vomiting occur, drink only clear liquids until the nausea and/or vomiting subsides. Call your physician if vomiting continues.  Special Instructions/Symptoms: Your throat may feel dry or sore from the anesthesia or the breathing tube placed in your throat during surgery. If this causes discomfort, gargle with warm salt water. The discomfort should disappear within 24 hours.  If you had a scopolamine patch placed behind your ear for the management of post- operative nausea and/or vomiting:  1. The medication in the patch is effective for 72 hours, after which it should be removed.  Wrap patch in a tissue and discard in the trash. Wash hands thoroughly with soap and water. 2. You may remove the patch earlier than 72 hours if you experience unpleasant side effects which may include dry mouth, dizziness or visual disturbances. 3. Avoid touching the patch. Wash your hands with soap and water after contact with the patch.      Regional Anesthesia Blocks  1. Numbness or the inability to move the "blocked" extremity may last from 3-48 hours after placement. The length of time depends on the medication injected and your individual response to the medication. If the numbness is not going away after 48 hours, call your surgeon.  2. The extremity that is blocked will need to be protected until the numbness is gone and the  Strength has returned. Because you cannot feel it, you  will need to take extra care to avoid injury. Because it may be weak, you may have difficulty moving it or using it. You may not know what position it is in without looking at it while the block is in effect.  3. For blocks in the legs and feet, returning to weight bearing and walking needs to be done carefully. You will need to wait until the numbness is entirely gone and the strength has returned. You should be able to move your leg and foot normally before you try and bear weight or walk. You will need someone to be with you when you first try to ensure you do not fall and possibly risk injury.  4. Bruising and tenderness at the needle site are common side effects and will resolve in a few days.  5. Persistent numbness or new problems with movement should be communicated to the surgeon or the [facility] [phone])/ [facility] [phone]).   Postoperative instructions:  Weightbearing instructions: non weight bearing  Dressing instructions: Keep your dressing and/or splint clean and dry at all times.  It will be removed at your first post-operative appointment.  Your stitches and/or staples will be removed at this visit.  Incision instructions:  Do not soak your incision for 3 weeks after surgery.  If the incision gets wet, pat dry and do not scrub the incision.  Pain control:  You have been given a prescription to be taken as directed for post-operative pain control.  In addition, elevate the operative extremity above the heart at all times to prevent  swelling and throbbing pain.  Take over-the-counter Colace, 100mg  by mouth twice a day while taking narcotic pain medications to help prevent constipation.  Follow up appointments: 1) 7 days for suture removal and wound check. 2) Dr. Roda Shutters as scheduled.   -------------------------------------------------------------------------------------------------------------  After Surgery Pain Control:  After your  surgery, post-surgical discomfort or pain is likely. This discomfort can last several days to a few weeks. At certain times of the day your discomfort may be more intense.  Did you receive a nerve block?  A nerve block can provide pain relief for one hour to two days after your surgery. As long as the nerve block is working, you will experience little or no sensation in the area the surgeon operated on.  As the nerve block wears off, you will begin to experience pain or discomfort. It is very important that you begin taking your prescribed pain medication before the nerve block fully wears off. Treating your pain at the first sign of the block wearing off will ensure your pain is better controlled and more tolerable when full-sensation returns. Do not wait until the pain is intolerable, as the medicine will be less effective. It is better to treat pain in advance than to try and catch up.  General Anesthesia:  If you did not receive a nerve block during your surgery, you will need to start taking your pain medication shortly after your surgery and should continue to do so as prescribed by your surgeon.  Pain Medication:  Most commonly we prescribe Vicodin and Percocet for post-operative pain. Both of these medications contain a combination of acetaminophen (Tylenol) and a narcotic to help control pain.   It takes between 30 and 45 minutes before pain medication starts to work. It is important to take your medication before your pain level gets too intense.   Nausea is a common side effect of many pain medications. You will want to eat something before taking your pain medicine to help prevent nausea.   If you are taking a prescription pain medication that contains acetaminophen, we recommend that you do not take additional over the counter acetaminophen (Tylenol).  Other pain relieving options:   Using a cold pack to ice the affected area a few times a day (15 to 20 minutes at a time) can help to  relieve pain, reduce swelling and bruising.   Elevation of the affected area can also help to reduce pain and swelling.

## 2018-11-14 NOTE — Anesthesia Procedure Notes (Signed)
Anesthesia Regional Block: Ankle block   Pre-Anesthetic Checklist: ,, timeout performed, Correct Patient, Correct Site, Correct Laterality, Correct Procedure, Correct Position, site marked, Risks and benefits discussed,  Surgical consent,  Pre-op evaluation,  At surgeon's request and post-op pain management  Laterality: Left  Prep: chloraprep       Needles:  Injection technique: Single-shot  Needle Type: Echogenic Needle     Needle Length: 5cm  Needle Gauge: 21   Needle insertion depth: 1 cm   Additional Needles:   Narrative:  Start time: 11/14/2018 12:42 PM End time: 11/14/2018 12:48 PM Injection made incrementally with aspirations every 3 mL.  Performed by: Personally  Anesthesiologist: Mal Amabile, MD  Additional Notes: Timeout performed. Patient sedated. Relevant anatomy ID'd using [place]. Incremental 2-69ml injection of LA with frequent aspiration. Patient tolerated procedure well.

## 2018-11-14 NOTE — Anesthesia Postprocedure Evaluation (Signed)
Anesthesia Post Note  Patient: Horticulturist, commercial  Procedure(s) Performed: CLOSED REDUCTION METATARSAL 5TH WITH IRRIGATION AND DEBRIDEMENT (Left Foot)     Patient location during evaluation: PACU Anesthesia Type: Regional and MAC Level of consciousness: awake and alert and oriented Pain management: pain level controlled Vital Signs Assessment: post-procedure vital signs reviewed and stable Respiratory status: spontaneous breathing, nonlabored ventilation and respiratory function stable Cardiovascular status: blood pressure returned to baseline and stable Postop Assessment: no apparent nausea or vomiting Anesthetic complications: no    Last Vitals:  Vitals:   11/14/18 1443 11/14/18 1445  BP: 127/87 (!) 124/91  Pulse:  (!) 58  Resp:  13  Temp:  36.4 C  SpO2:  100%    Last Pain:  Vitals:   11/14/18 1134  TempSrc: Oral  PainSc: 8     LLE Motor Response: Purposeful movement (11/14/18 1455) LLE Sensation: Numbness (11/14/18 1455)          Constantin Hillery A.

## 2018-11-14 NOTE — Anesthesia Preprocedure Evaluation (Signed)
Anesthesia Evaluation  Patient identified by MRN, date of birth, ID band Patient awake    Reviewed: Allergy & Precautions, NPO status , Patient's Chart, lab work & pertinent test results  Airway Mallampati: II  TM Distance: >3 FB Neck ROM: Full    Dental no notable dental hx. (+) Teeth Intact   Pulmonary asthma ,    Pulmonary exam normal breath sounds clear to auscultation       Cardiovascular negative cardio ROS Normal cardiovascular exam Rhythm:Regular Rate:Normal     Neuro/Psych negative neurological ROS  negative psych ROS   GI/Hepatic negative GI ROS, Neg liver ROS,   Endo/Other  negative endocrine ROS  Renal/GU negative Renal ROS  negative genitourinary   Musculoskeletal Dislocation left 5th toe Fx left 3rd toe proximal phalanx   Abdominal   Peds  Hematology negative hematology ROS (+)   Anesthesia Other Findings   Reproductive/Obstetrics                             Anesthesia Physical Anesthesia Plan  ASA: II  Anesthesia Plan: MAC and Regional   Post-op Pain Management:    Induction: Intravenous  PONV Risk Score and Plan: 2 and Ondansetron, Propofol infusion and Treatment may vary due to age or medical condition  Airway Management Planned: Natural Airway, Nasal Cannula and Simple Face Mask  Additional Equipment:   Intra-op Plan:   Post-operative Plan:   Informed Consent: I have reviewed the patients History and Physical, chart, labs and discussed the procedure including the risks, benefits and alternatives for the proposed anesthesia with the patient or authorized representative who has indicated his/her understanding and acceptance.     Dental advisory given  Plan Discussed with: CRNA and Surgeon  Anesthesia Plan Comments:         Anesthesia Quick Evaluation

## 2018-11-15 ENCOUNTER — Encounter (HOSPITAL_BASED_OUTPATIENT_CLINIC_OR_DEPARTMENT_OTHER): Payer: Self-pay | Admitting: Orthopaedic Surgery

## 2019-01-19 ENCOUNTER — Encounter (HOSPITAL_COMMUNITY): Payer: Self-pay | Admitting: Emergency Medicine

## 2019-01-19 ENCOUNTER — Emergency Department (HOSPITAL_COMMUNITY)
Admission: EM | Admit: 2019-01-19 | Discharge: 2019-01-19 | Disposition: A | Discharge status: Home / Self Care | Payer: Self-pay | Attending: Emergency Medicine | Admitting: Emergency Medicine

## 2019-01-19 DIAGNOSIS — S93105D Unspecified dislocation of left toe(s), subsequent encounter: Secondary | ICD-10-CM | POA: Insufficient documentation

## 2019-01-19 DIAGNOSIS — Z4802 Encounter for removal of sutures: Secondary | ICD-10-CM

## 2019-01-19 DIAGNOSIS — J45909 Unspecified asthma, uncomplicated: Secondary | ICD-10-CM | POA: Insufficient documentation

## 2019-01-19 DIAGNOSIS — X58XXXD Exposure to other specified factors, subsequent encounter: Secondary | ICD-10-CM | POA: Insufficient documentation

## 2019-01-19 NOTE — ED Triage Notes (Signed)
Pt./ translator stated, he has had staples in Nov 14 2018.He had surgery   But pt.doesn't know who or what. Chart reads open reduction and irrigation. He didn't know when he was suppose to come. The staple is on the little toe.

## 2019-01-19 NOTE — ED Provider Notes (Signed)
[facility] EMERGENCY DEPARTMENT Provider Note   CSN: [id number] Arrival date & time: 01/19/19  1208    History   Chief Complaint Chief Complaint  Patient presents with  . Suture / Staple Removal    HPI Corey Anthony is a 33 y.o. male with history of asthma who presents for suture removal from toe surgery on 11/14/2018.  He never followed up with his surgeon because he did not know he was supposed to and no one called him.  He has a K wire in place and 2 sutures.  He denies any pain or fevers.     The history is provided by the patient and a relative. The history is limited by a language barrier. A language interpreter was used (family member, could not find an Civil engineer, contracting).  Suture / Staple Removal    Past Medical History:  Diagnosis Date  . Asthma     Patient Active Problem List   Diagnosis Date Noted  . Dislocation of fifth toe, left, open, initial encounter 11/14/2018  . Closed fracture of third toe of left foot, initial encounter 11/14/2018    Past Surgical History:  Procedure Laterality Date  . CLOSED REDUCTION METATARSAL Left 11/14/2018   Procedure: CLOSED REDUCTION METATARSAL 5TH WITH IRRIGATION AND DEBRIDEMENT;  Surgeon: Leandrew Koyanagi, MD;  Location: [facility];  Service: Orthopedics;  Laterality: Left;  BLOCK IN PREOP  . ORIF TOE FRACTURE Left 11/07/2018   Procedure: OPEN REDUCTION PERCUTANEOUS PINNING LEFT 3RD TOE PROXIMAL PHALANX, IRRIGATION AND DEBRIDEMENT LEFT 5TH TOE DISLOCATION;  Surgeon: Leandrew Koyanagi, MD;  Location: [facility];  Service: Orthopedics;  Laterality: Left;        Home Medications    Prior to Admission medications   Medication Sig Start Date End Date Taking? Authorizing Provider  cephALEXin (KEFLEX) 500 MG capsule Take 1 capsule (500 mg total) by mouth 4 (four) times daily. 11/06/18   Leandrew Koyanagi, MD  cetirizine (ZYRTEC) 10 MG tablet Take 1 tablet (10 mg total) by mouth daily. 09/11/18    Tasia Catchings, Amy V, PA-C  HYDROcodone-acetaminophen (NORCO/VICODIN) 5-325 MG tablet Take 1-2 tablets by mouth every 6 (six) hours as needed. 10/20/18   Langston Masker B, PA-C  mupirocin ointment (BACTROBAN) 2 % Apply 1 application topically 2 (two) times daily. 11/06/18   Leandrew Koyanagi, MD  Olopatadine HCl 0.2 % SOLN Apply 1 drop to eye daily. 09/11/18   Tasia Catchings, Amy V, PA-C  ondansetron (ZOFRAN) 4 MG tablet Take 1-2 tablets (4-8 mg total) by mouth every 8 (eight) hours as needed for nausea or vomiting. 11/14/18   Leandrew Koyanagi, MD  OVER THE COUNTER MEDICATION "nose spray"    [provider]  oxyCODONE-acetaminophen (PERCOCET) 5-325 MG tablet Take 1-2 tablets by mouth every 6 (six) hours as needed for severe pain. 11/14/18   Leandrew Koyanagi, MD    Family History No family history on file.  Social History Social History   Tobacco Use  . Smoking status: Never Smoker  . Smokeless tobacco: Never Used  Substance Use Topics  . Alcohol use: Yes  . Drug use: No     Allergies   Patient has no known allergies.   Review of Systems Review of Systems  Constitutional: Negative for fever.  Musculoskeletal: Negative for arthralgias.  Skin: Positive for wound.     Physical Exam Updated Vital Signs BP (!) 144/100 (BP Location: Right Arm)   Pulse 94   Temp  97.7 F (36.5 C)   Resp 17   SpO2 97%   Physical Exam Vitals signs and nursing note reviewed.  Constitutional:      General: He is not in acute distress.    Appearance: He is well-developed. He is not diaphoretic.  HENT:     Head: Normocephalic and atraumatic.     Mouth/Throat:     Pharynx: No oropharyngeal exudate.  Eyes:     General: No scleral icterus.       Right eye: No discharge.        Left eye: No discharge.     Conjunctiva/sclera: Conjunctivae normal.  Neck:     Musculoskeletal: Normal range of motion and neck supple.     Thyroid: No thyromegaly.  Cardiovascular:     Rate and Rhythm: Normal rate and regular rhythm.      Heart sounds: Normal heart sounds. No murmur. No friction rub. No gallop.   Pulmonary:     Effort: Pulmonary effort is normal. No respiratory distress.     Breath sounds: Normal breath sounds. No stridor. No wheezing or rales.  Musculoskeletal:     Comments: 2 sutures in place between the fourth and fifth toes; K wire in place on the fifth; no pain on palpation, no erythema or drainage  Lymphadenopathy:     Cervical: No cervical adenopathy.  Skin:    General: Skin is warm and dry.     Coloration: Skin is not pale.     Findings: No rash.  Neurological:     Mental Status: He is alert.     Coordination: Coordination normal.      ED Treatments / Results  Labs (all labs ordered are listed, but only abnormal results are displayed) Labs Reviewed - No data to display  EKG None  Radiology No results found.  Procedures .Suture Removal  Date/Time: 01/19/2019 12:38 PM Performed by: Emi HolesLaw, Nita Whitmire M, PA-C Authorized by: Emi HolesLaw, Kailly Richoux M, PA-C   Consent:    Consent obtained:  Verbal   Consent given by:  Patient   Risks discussed:  Pain and bleeding   Alternatives discussed:  No treatment Location:    Location:  Lower extremity   Lower extremity location:  Toe   Toe location:  L little toe Procedure details:    Wound appearance:  No signs of infection, good wound healing and clean   Number of sutures removed:  2 Post-procedure details:    Post-removal:  Dressing applied   Patient tolerance of procedure:  Tolerated well, no immediate complications   (including critical care time)  Medications Ordered in ED Medications - No data to display   Initial Impression / Assessment and Plan / ED Course  I have reviewed the triage vital signs and the nursing notes.  Pertinent labs & imaging results that were available during my care of the patient were reviewed by me and considered in my medical decision making (see chart for details).        Patient with sutures in place from  11/14/2018 from toe surgery by orthopedist, Dr. Roda ShuttersXu.  Sutures removed without difficulty.  There are no signs of infection.  Patient advised he will need to follow-up with his orthopedic doctor as soon as possible for recheck and probable removal of his K wire.  Patient understands and agrees with plan.  Patient vital stable throughout ED course and discharged in satisfactory condition.  Final Clinical Impressions(s) / ED Diagnoses   Final diagnoses:  Visit for suture removal  ED Discharge Orders    None       Verdis PrimeLaw, Anwen Cannedy M, PA-C 01/19/19 1251    Tilden Fossaees, Elizabeth, MD 01/19/19 1434

## 2019-01-19 NOTE — Discharge Instructions (Addendum)
Please call your orthopedist, Dr. Erlinda Hong, tomorrow to schedule appointment as soon as possible for your surgical follow-up.  You will probably need to have the wire in your toe removed at that visit, but we cannot do that from the emergency department.  Please return the emergency department develop pain, fever, redness, drainage from the wounds.

## 2019-01-23 ENCOUNTER — Ambulatory Visit (INDEPENDENT_AMBULATORY_CARE_PROVIDER_SITE_OTHER): Payer: Self-pay

## 2019-01-23 ENCOUNTER — Ambulatory Visit (INDEPENDENT_AMBULATORY_CARE_PROVIDER_SITE_OTHER): Payer: Self-pay | Admitting: Physician Assistant

## 2019-01-23 ENCOUNTER — Other Ambulatory Visit: Payer: Self-pay | Admitting: Physician Assistant

## 2019-01-23 ENCOUNTER — Encounter: Payer: Self-pay | Admitting: Physician Assistant

## 2019-01-23 ENCOUNTER — Other Ambulatory Visit: Payer: Self-pay

## 2019-01-23 DIAGNOSIS — S93105A Unspecified dislocation of left toe(s), initial encounter: Secondary | ICD-10-CM

## 2019-01-23 DIAGNOSIS — S92502A Displaced unspecified fracture of left lesser toe(s), initial encounter for closed fracture: Secondary | ICD-10-CM

## 2019-01-23 MED ORDER — TRAMADOL HCL 50 MG PO TABS: 50.0000 mg | ORAL_TABLET | Freq: Three times a day (TID) | ORAL | 0 refills | Status: DC | PRN

## 2019-01-23 NOTE — Progress Notes (Signed)
   Post-Op Visit Note   Patient: Corey Anthony           Date of Birth: 04-04-1986           MRN: [id number] Visit Date: 01/23/2019 PCP: Department, [place]:  Chief Complaint:  Chief Complaint  Patient presents with  . Left Foot - Pain, Routine Post Op   Visit Diagnoses:  1. Closed fracture of phalanx of left third toe, initial encounter   2. Closed dislocation of fifth toe of left foot, initial encounter     Plan: Patient is a 33 year old gentleman who comes in today with his brother who is his Burmese interpreter.  There is still some of the language barrier with the brother.  The patient is over 2 months status post irrigation debridement and closed reduction percutaneous pinning left toe fifth phalanx fracture as well as irrigation and debridement third toe fracture, date of surgery 11/14/2018.  This is his first postoperative visit.  He was seen in the ED on 01/19/2019 for suture removal.  He has been weightbearing in a regular shoe over the past several weeks.  He has increased pain with this.  No fevers or chills.  No drainage to the wounds.  Examination of his left foot reveals a fully healed surgical incision.  No evidence of infection.  The pin is still in place.  No evidence of infection around the pin.  Today, the pin was pulled.  Mupirocin and compression dressing applied.  Due to the patient still experiencing pain in a regular shoe, I have advised him to wear his postoperative shoe when weightbearing for the next 4 weeks.  Follow-up with [place] in 4 weeks time for repeat evaluation and possible three-view x-rays of the left foot.  Call with concerns or questions in the meantime.  Follow-Up Instructions: Return in about 4 weeks (around 02/20/2019).   Orders:  Orders Placed This Encounter  Procedures  . XR Foot Complete Left   No orders of the defined types were placed in this encounter.   Imaging: Xr Foot Complete Left  Result Date: 01/23/2019  X-rays demonstrate a nearly healed third proximal phalanx fracture and fifth distal phalanx fracture   PMFS History: Patient Active Problem List   Diagnosis Date Noted  . Dislocation of fifth toe, left, open, initial encounter 11/14/2018  . Closed fracture of third toe of left foot, initial encounter 11/14/2018   Past Medical History:  Diagnosis Date  . Asthma     History reviewed. No pertinent family history.  Past Surgical History:  Procedure Laterality Date  . CLOSED REDUCTION METATARSAL Left 11/14/2018   Procedure: CLOSED REDUCTION METATARSAL 5TH WITH IRRIGATION AND DEBRIDEMENT;  Surgeon: Leandrew Koyanagi, MD;  Location: [facility];  Service: Orthopedics;  Laterality: Left;  BLOCK IN PREOP  . ORIF TOE FRACTURE Left 11/07/2018   Procedure: OPEN REDUCTION PERCUTANEOUS PINNING LEFT 3RD TOE PROXIMAL PHALANX, IRRIGATION AND DEBRIDEMENT LEFT 5TH TOE DISLOCATION;  Surgeon: Leandrew Koyanagi, MD;  Location: [facility];  Service: Orthopedics;  Laterality: Left;   Social History   Occupational History  . Not on file  Tobacco Use  . Smoking status: Never Smoker  . Smokeless tobacco: Never Used  Substance and Sexual Activity  . Alcohol use: Yes  . Drug use: No  . Sexual activity: Not on file

## 2021-02-24 DIAGNOSIS — F1010 Alcohol abuse, uncomplicated: Secondary | ICD-10-CM

## 2021-02-24 HISTORY — DX: Alcohol abuse, uncomplicated: F10.10

## 2021-03-04 ENCOUNTER — Emergency Department (HOSPITAL_COMMUNITY)
Admission: EM | Admit: 2021-03-04 | Discharge: 2021-03-04 | Discharge status: Against Medical Advice | Payer: Self-pay | Attending: Emergency Medicine | Admitting: Emergency Medicine

## 2021-03-04 DIAGNOSIS — Z5321 Procedure and treatment not carried out due to patient leaving prior to being seen by health care provider: Secondary | ICD-10-CM | POA: Insufficient documentation

## 2021-03-04 DIAGNOSIS — F10239 Alcohol dependence with withdrawal, unspecified: Secondary | ICD-10-CM | POA: Insufficient documentation

## 2021-03-04 NOTE — ED Notes (Signed)
Pt was asked to stay but a family member stated they were leaving

## 2021-03-07 ENCOUNTER — Inpatient Hospital Stay (HOSPITAL_COMMUNITY)
Admission: EM | Admit: 2021-03-07 | Discharge: 2021-03-13 | DRG: 897 | Disposition: A | Discharge status: Home / Self Care | Payer: 59 | Attending: Internal Medicine | Admitting: Internal Medicine

## 2021-03-07 ENCOUNTER — Encounter (HOSPITAL_COMMUNITY): Payer: Self-pay | Admitting: *Deleted

## 2021-03-07 ENCOUNTER — Emergency Department (HOSPITAL_COMMUNITY): Payer: 59

## 2021-03-07 DIAGNOSIS — F10231 Alcohol dependence with withdrawal delirium: Secondary | ICD-10-CM | POA: Diagnosis not present

## 2021-03-07 DIAGNOSIS — E871 Hypo-osmolality and hyponatremia: Secondary | ICD-10-CM | POA: Diagnosis present

## 2021-03-07 DIAGNOSIS — F1010 Alcohol abuse, uncomplicated: Secondary | ICD-10-CM | POA: Diagnosis present

## 2021-03-07 DIAGNOSIS — F10931 Alcohol use, unspecified with withdrawal delirium: Secondary | ICD-10-CM | POA: Diagnosis present

## 2021-03-07 DIAGNOSIS — Z79899 Other long term (current) drug therapy: Secondary | ICD-10-CM

## 2021-03-07 DIAGNOSIS — G9340 Encephalopathy, unspecified: Secondary | ICD-10-CM | POA: Diagnosis present

## 2021-03-07 DIAGNOSIS — F1020 Alcohol dependence, uncomplicated: Secondary | ICD-10-CM

## 2021-03-07 DIAGNOSIS — K7290 Hepatic failure, unspecified without coma: Secondary | ICD-10-CM | POA: Diagnosis present

## 2021-03-07 DIAGNOSIS — Y900 Blood alcohol level of less than 20 mg/100 ml: Secondary | ICD-10-CM | POA: Diagnosis present

## 2021-03-07 DIAGNOSIS — E876 Hypokalemia: Secondary | ICD-10-CM | POA: Diagnosis present

## 2021-03-07 DIAGNOSIS — R0602 Shortness of breath: Secondary | ICD-10-CM

## 2021-03-07 DIAGNOSIS — H5500 Unspecified nystagmus: Secondary | ICD-10-CM | POA: Diagnosis present

## 2021-03-07 DIAGNOSIS — K7010 Alcoholic hepatitis without ascites: Secondary | ICD-10-CM | POA: Diagnosis present

## 2021-03-07 DIAGNOSIS — G4700 Insomnia, unspecified: Secondary | ICD-10-CM | POA: Diagnosis present

## 2021-03-07 DIAGNOSIS — R112 Nausea with vomiting, unspecified: Secondary | ICD-10-CM | POA: Diagnosis not present

## 2021-03-07 DIAGNOSIS — R7989 Other specified abnormal findings of blood chemistry: Secondary | ICD-10-CM | POA: Diagnosis present

## 2021-03-07 DIAGNOSIS — J45909 Unspecified asthma, uncomplicated: Secondary | ICD-10-CM | POA: Diagnosis present

## 2021-03-07 DIAGNOSIS — Z20822 Contact with and (suspected) exposure to covid-19: Secondary | ICD-10-CM | POA: Diagnosis present

## 2021-03-07 DIAGNOSIS — R1013 Epigastric pain: Secondary | ICD-10-CM | POA: Diagnosis present

## 2021-03-07 LAB — CBC WITH DIFFERENTIAL/PLATELET
Abs Immature Granulocytes: 0.05 10*3/uL (ref 0.00–0.07)
Basophils Absolute: 0.1 10*3/uL (ref 0.0–0.1)
Basophils Relative: 1 %
Eosinophils Absolute: 0 10*3/uL (ref 0.0–0.5)
Eosinophils Relative: 0 %
HCT: 40.3 % (ref 39.0–52.0)
Hemoglobin: 14.4 g/dL (ref 13.0–17.0)
Immature Granulocytes: 1 %
Lymphocytes Relative: 11 %
Lymphs Abs: 0.7 10*3/uL (ref 0.7–4.0)
MCH: 30.4 pg (ref 26.0–34.0)
MCHC: 35.7 g/dL (ref 30.0–36.0)
MCV: 85.2 fL (ref 80.0–100.0)
Monocytes Absolute: 1.2 10*3/uL — ABNORMAL HIGH (ref 0.1–1.0)
Monocytes Relative: 18 %
Neutro Abs: 4.6 10*3/uL (ref 1.7–7.7)
Neutrophils Relative %: 69 %
Platelets: 159 10*3/uL (ref 150–400)
RBC: 4.73 MIL/uL (ref 4.22–5.81)
RDW: 12.5 % (ref 11.5–15.5)
WBC: 6.7 10*3/uL (ref 4.0–10.5)
nRBC: 0 % (ref 0.0–0.2)

## 2021-03-07 LAB — TROPONIN I (HIGH SENSITIVITY)
Troponin I (High Sensitivity): 6 ng/L (ref <18)
Troponin I (High Sensitivity): 6 ng/L (ref <18)

## 2021-03-07 LAB — BASIC METABOLIC PANEL
Anion gap: 17 — ABNORMAL HIGH (ref 5–15)
BUN: 5 mg/dL — ABNORMAL LOW (ref 6–20)
CO2: 25 mmol/L (ref 22–32)
Calcium: 9.7 mg/dL (ref 8.9–10.3)
Chloride: 82 mmol/L — ABNORMAL LOW (ref 98–111)
Creatinine, Ser: 0.57 mg/dL — ABNORMAL LOW (ref 0.61–1.24)
GFR, Estimated: >60 mL/min (ref >60)
Glucose, Bld: 129 mg/dL — ABNORMAL HIGH (ref 70–99)
Potassium: 2.9 mmol/L — ABNORMAL LOW (ref 3.5–5.1)
Sodium: 124 mmol/L — ABNORMAL LOW (ref 135–145)

## 2021-03-07 MED ORDER — SODIUM CHLORIDE 0.9 % IV SOLN
1000.0000 mL | INTRAVENOUS | Status: DC
  Administered 2021-03-07: 1000 mL via INTRAVENOUS

## 2021-03-07 MED ORDER — THIAMINE HCL 100 MG/ML IJ SOLN
100.0000 mg | Freq: Every day | INTRAMUSCULAR | Status: DC
  Administered 2021-03-07: 100 mg via INTRAVENOUS
  Filled 2021-03-07: qty 2

## 2021-03-07 MED ORDER — SODIUM CHLORIDE 0.9 % IV BOLUS (SEPSIS)
1000.0000 mL | Freq: Once | INTRAVENOUS | Status: AC
  Administered 2021-03-07: 1000 mL via INTRAVENOUS

## 2021-03-07 MED ORDER — POTASSIUM CHLORIDE CRYS ER 20 MEQ PO TBCR
40.0000 meq | EXTENDED_RELEASE_TABLET | Freq: Once | ORAL | Status: AC
  Administered 2021-03-07: 40 meq via ORAL
  Filled 2021-03-07: qty 2

## 2021-03-07 MED ORDER — FOLIC ACID 1 MG PO TABS
1.0000 mg | ORAL_TABLET | Freq: Once | ORAL | Status: AC
  Administered 2021-03-07: 1 mg via ORAL
  Filled 2021-03-07: qty 1

## 2021-03-07 NOTE — ED Provider Notes (Signed)
[facility] EMERGENCY DEPARTMENT Provider Note   CSN: [id number] Arrival date & time: 03/07/21  0932     History Chief Complaint  Patient presents with   Chest Pain   Alcohol Problem    Corey Anthony is a 35 y.o. male.   Chest Pain Alcohol Problem Associated symptoms include chest pain.    Pt has been drinking a lot of alcohol.  Family states he will drink a whole bottle and will try to get more if he does not have it.  Unable to quantify more than that..  HE has been drinking alcohol for a long period of time.  He has become disoriented per family.  WHen he stops drinking he started to become confused.  He has not been able to control his balance when he walks.  His last drink was last Wednesday.  Pt went to the ED on Friday but left due to the wait.  Pt states he is having tingling in his legs.  He feels weak when he walks.  He is not sleeping well at night.  He is not able to eat without vomiting.    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", chronic double vision  Patient Active Problem List   Diagnosis Date Noted   Dislocation of fifth toe, left, open, initial encounter 11/14/2018   Closed fracture of third toe of left foot, initial encounter 11/14/2018    Past Surgical History:  Procedure Laterality Date   CLOSED REDUCTION METATARSAL Left 11/14/2018   Procedure: CLOSED REDUCTION METATARSAL 5TH WITH IRRIGATION AND DEBRIDEMENT;  Surgeon: Tarry Kos, MD;  Location: [facility];  Service: Orthopedics;  Laterality: Left;  BLOCK IN PREOP   ORIF TOE FRACTURE Left 11/07/2018   Procedure: OPEN REDUCTION PERCUTANEOUS PINNING LEFT 3RD TOE PROXIMAL PHALANX, IRRIGATION AND DEBRIDEMENT LEFT 5TH TOE DISLOCATION;  Surgeon: Tarry Kos, MD;  Location: [facility];  Service: Orthopedics;  Laterality: Left;       History reviewed. No pertinent family history.  Social History   Tobacco  Use   Smoking status: Never   Smokeless tobacco: Never  Vaping Use   Vaping Use: Never used  Substance Use Topics   Alcohol use: Yes   Drug use: No    Home Medications Prior to Admission medications   Medication Sig Start Date End Date Taking? Authorizing Provider  cephALEXin (KEFLEX) 500 MG capsule Take 1 capsule (500 mg total) by mouth 4 (four) times daily. 11/06/18   Tarry Kos, MD  cetirizine (ZYRTEC) 10 MG tablet Take 1 tablet (10 mg total) by mouth daily. 09/11/18   Cathie Hoops, Amy V, PA-C  HYDROcodone-acetaminophen (NORCO/VICODIN) 5-325 MG tablet Take 1-2 tablets by mouth every 6 (six) hours as needed. 10/20/18   Aviva Kluver B, PA-C  mupirocin ointment (BACTROBAN) 2 % Apply 1 application topically 2 (two) times daily. 11/06/18   Tarry Kos, MD  Olopatadine HCl 0.2 % SOLN Apply 1 drop to eye daily. 09/11/18   Cathie Hoops, Amy V, PA-C  ondansetron (ZOFRAN) 4 MG tablet Take 1-2 tablets (4-8 mg total) by mouth every 8 (eight) hours as needed for nausea or vomiting. 11/14/18   Tarry Kos, MD  OVER THE COUNTER MEDICATION "nose spray"    [provider]  oxyCODONE-acetaminophen (PERCOCET) 5-325 MG tablet Take 1-2 tablets by mouth every 6 (six) hours as needed for severe pain. 11/14/18   Tarry Kos,  MD  traMADol (ULTRAM) 50 MG tablet Take 1 tablet (50 mg total) by mouth 3 (three) times daily as needed. 01/23/19   Cristie Hem, PA-C    Allergies    Patient has no known allergies.  Review of Systems   Review of Systems  Cardiovascular:  Positive for chest pain.   Physical Exam Updated Vital Signs BP (!) 144/98   Pulse 66   Temp 98.4 F (36.9 C)   Resp 14   SpO2 100%   Physical Exam Neurological:     Comments: Pt knows he is in the hospital, sates the year is 2012, states he was born in sept 1985/09/27    ED Results / Procedures / Treatments   Labs (all labs ordered are listed, but only abnormal results are displayed) Labs Reviewed  BASIC METABOLIC PANEL - Abnormal;  Notable for the following components:      Result Value   Sodium 124 (*)    Potassium 2.9 (*)    Chloride 82 (*)    Glucose, Bld 129 (*)    BUN 5 (*)    Creatinine, Ser 0.57 (*)    Anion gap 17 (*)    All other components within normal limits  CBC WITH DIFFERENTIAL/PLATELET - Abnormal; Notable for the following components:   Monocytes Absolute 1.2 (*)    All other components within normal limits  OSMOLALITY  SODIUM, URINE, RANDOM  ETHANOL  HEPATIC FUNCTION PANEL  AMMONIA  TROPONIN I (HIGH SENSITIVITY)  TROPONIN I (HIGH SENSITIVITY)    EKG EKG Interpretation  Date/Time:  Monday March 07 2021 11:04:10 EDT Ventricular Rate:  80 PR Interval:  146 QRS Duration: 82 QT Interval:  426 QTC Calculation: 491 R Axis:   69 Text Interpretation: Normal sinus rhythm Nonspecific T wave abnormality Prolonged QT Abnormal ECG No old tracing to compare Confirmed by Linwood Dibbles [phone]) on 03/07/2021 7:26:02 PM  Radiology DG Ribs Unilateral W/Chest Right  Result Date: 03/07/2021 CLINICAL DATA:  Rib injury EXAM: RIGHT RIBS AND CHEST - 3+ VIEW COMPARISON:  None. FINDINGS: No pneumothorax or blunting of the costophrenic angles to suggest pleural effusion. Cardiac and mediastinal margins appear normal. The lungs appear clear. No pneumothorax. Marker noted projecting over the right lateral ribs in the vicinity of the fifth and sixth ribs. No discrete rib discontinuity is identified. IMPRESSION: 1. No well-defined rib injury is identified. The lungs appear clear. Electronically Signed   By: Gaylyn Rong M.D.   On: 03/07/2021 12:06   CT HEAD WO CONTRAST ( )  Result Date: 03/07/2021 CLINICAL DATA:  Mental status change. EXAM: CT HEAD WITHOUT CONTRAST TECHNIQUE: Contiguous axial images were obtained from the base of the skull through the vertex without intravenous contrast. COMPARISON:  CT head 04/05/2020. FINDINGS: Brain: No evidence of acute infarction, hemorrhage, hydrocephalus, extra-axial  collection or mass lesion/mass effect. Vascular: No hyperdense vessel or unexpected calcification. Skull: Normal. Negative for fracture or focal lesion. Sinuses/Orbits: No acute finding. Other: None. IMPRESSION: No acute intracranial abnormality. Electronically Signed   By: Darliss Cheney M.D.   On: 03/07/2021 21:50    Procedures .Critical Care Performed by: Linwood Dibbles, MD Authorized by: Linwood Dibbles, MD   Critical care provider statement:    Critical care time (minutes):  45   Critical care was time spent personally by me on the following activities:  Discussions with consultants, evaluation of patient's response to treatment, examination of patient, ordering and performing treatments and interventions, ordering and review of laboratory studies, ordering  and review of radiographic studies, pulse oximetry, re-evaluation of patient's condition, obtaining history from patient or surrogate and review of old charts   Medications Ordered in ED Medications  thiamine (B-1) injection 100 mg (100 mg Intravenous Given 03/07/21 2127)  sodium chloride 0.9 % bolus 1,000 mL (0 mLs Intravenous Stopped 03/07/21 2332)    Followed by  0.9 %  sodium chloride infusion (1,000 mLs Intravenous New Bag/Given 03/07/21 2152)  folic acid (FOLVITE) tablet 1 mg (1 mg Oral Given 03/07/21 2153)  potassium chloride SA (KLOR-CON) CR tablet 40 mEq (40 mEq Oral Given 03/07/21 2152)    ED Course  I have reviewed the triage vital signs and the nursing notes.  Pertinent labs & imaging results that were available during my care of the patient were reviewed by me and considered in my medical decision making (see chart for details).  Clinical Course as of 03/07/21 2349  Mon Mar 07, 2021  1956 Hyponatremia and hypokalemia new   CBC normal [JK]  2238 CT without acute findings [JK]    Clinical Course User Index [JK] Linwood Dibbles, MD   MDM Rules/Calculators/A&P                           Patient presented to the ED for evaluation of  weakness and confusion in the setting of chronic alcoholism.  Patient.  Hemodynamically stable in the ED.  No signs of acute intoxication.  Alcohol level is pending.  Patient's laboratory tests are notable for hypokalemia as well as hyponatremia.  Likely related to his vomiting..  Potassium replacement and fluid replacement ordered.  Sodium level Could be contributing to some of his confusion.  Is also concerned about the possibilities of Warnicke's encephalopathy.  Patient was started on IV thiamine.  Ammonia hepatic function ethanol level is pending.  We will consult the medical service for admission and further treatment Final Clinical Impression(s) / ED Diagnoses Final diagnoses:  Alcoholism (HCC)  Hyponatremia  Hypokalemia     Linwood Dibbles, MD 03/07/21 2351

## 2021-03-07 NOTE — ED Triage Notes (Signed)
Pt speaks [place], no translator available for that but able to use Pharmacist, community. Pt reports right side chest pain x 1 month that became worse today. Has been seen for same in past and given prescription for it. States he has an alcohol problem but last alcohol use was last week and he only drinks once a week.

## 2021-03-07 NOTE — ED Notes (Signed)
Patient transported to CT 

## 2021-03-07 NOTE — ED Provider Notes (Signed)
Emergency Medicine Provider Triage Evaluation Note  Corey Anthony , a 35 y.o. male  was evaluated in triage.  Pt complains of right-sided exertional type chest pain intermittent over the last month with associated shortness of breath.  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  Review of Systems  Positive: Right-sided chest pain, shortness of breath Negative: Nausea, vomiting, fevers, chills  Physical Exam  BP 135/88 (BP Location: Left Arm)   Pulse 80   Temp 98.4 F (36.9 C)   Resp 17   SpO2 98%  Gen:   Awake, no distress   Resp:  Normal effort  MSK:   Moves extremities without difficulty  Other:  RRR no M/R/G.  Lungs CTA B.  Mild tenderness palpation of the right chest wall.  Medical Decision Making  Medically screening exam initiated at 11:08 AM.  Appropriate orders placed.  Tashawn Laswell was informed that the remainder of the evaluation will be completed by another provider, this initial triage assessment does not replace that evaluation, and the importance of remaining in the ED until their evaluation is complete.  Suspect musculoskeletal etiology, however given reported shortness of breath and intermittent palpitations, do feel cardiac work-up is warranted.  This chart was dictated using voice recognition software, Dragon. Despite the best efforts of this provider to proofread and correct errors, errors may still occur which can change documentation meaning.    Paris Lore, PA-C 03/07/21 1109    Tanda Rockers A, DO 03/07/21 1757

## 2021-03-07 NOTE — ED Triage Notes (Signed)
Translator used to go over [employer] form with pt

## 2021-03-08 ENCOUNTER — Inpatient Hospital Stay (HOSPITAL_COMMUNITY): Payer: 59

## 2021-03-08 ENCOUNTER — Encounter (HOSPITAL_COMMUNITY): Payer: Self-pay | Admitting: Internal Medicine

## 2021-03-08 DIAGNOSIS — F1010 Alcohol abuse, uncomplicated: Secondary | ICD-10-CM

## 2021-03-08 DIAGNOSIS — K7010 Alcoholic hepatitis without ascites: Secondary | ICD-10-CM | POA: Diagnosis present

## 2021-03-08 DIAGNOSIS — K7290 Hepatic failure, unspecified without coma: Secondary | ICD-10-CM | POA: Diagnosis present

## 2021-03-08 DIAGNOSIS — Z20822 Contact with and (suspected) exposure to covid-19: Secondary | ICD-10-CM | POA: Diagnosis present

## 2021-03-08 DIAGNOSIS — F10231 Alcohol dependence with withdrawal delirium: Secondary | ICD-10-CM | POA: Diagnosis present

## 2021-03-08 DIAGNOSIS — F10931 Alcohol use, unspecified with withdrawal delirium: Secondary | ICD-10-CM | POA: Diagnosis present

## 2021-03-08 DIAGNOSIS — R7989 Other specified abnormal findings of blood chemistry: Secondary | ICD-10-CM

## 2021-03-08 DIAGNOSIS — F1020 Alcohol dependence, uncomplicated: Secondary | ICD-10-CM

## 2021-03-08 DIAGNOSIS — Y900 Blood alcohol level of less than 20 mg/100 ml: Secondary | ICD-10-CM | POA: Diagnosis present

## 2021-03-08 DIAGNOSIS — G4700 Insomnia, unspecified: Secondary | ICD-10-CM | POA: Diagnosis present

## 2021-03-08 DIAGNOSIS — Z79899 Other long term (current) drug therapy: Secondary | ICD-10-CM | POA: Diagnosis not present

## 2021-03-08 DIAGNOSIS — E871 Hypo-osmolality and hyponatremia: Secondary | ICD-10-CM | POA: Diagnosis present

## 2021-03-08 DIAGNOSIS — H5500 Unspecified nystagmus: Secondary | ICD-10-CM | POA: Diagnosis present

## 2021-03-08 DIAGNOSIS — R1013 Epigastric pain: Secondary | ICD-10-CM | POA: Diagnosis present

## 2021-03-08 DIAGNOSIS — E876 Hypokalemia: Secondary | ICD-10-CM | POA: Diagnosis present

## 2021-03-08 DIAGNOSIS — G9340 Encephalopathy, unspecified: Secondary | ICD-10-CM

## 2021-03-08 DIAGNOSIS — J45909 Unspecified asthma, uncomplicated: Secondary | ICD-10-CM | POA: Diagnosis present

## 2021-03-08 DIAGNOSIS — R112 Nausea with vomiting, unspecified: Secondary | ICD-10-CM | POA: Diagnosis present

## 2021-03-08 LAB — CBC WITH DIFFERENTIAL/PLATELET
Abs Immature Granulocytes: 0.03 10*3/uL (ref 0.00–0.07)
Basophils Absolute: 0 10*3/uL (ref 0.0–0.1)
Basophils Relative: 1 %
Eosinophils Absolute: 0 10*3/uL (ref 0.0–0.5)
Eosinophils Relative: 1 %
HCT: 35 % — ABNORMAL LOW (ref 39.0–52.0)
Hemoglobin: 12.3 g/dL — ABNORMAL LOW (ref 13.0–17.0)
Immature Granulocytes: 1 %
Lymphocytes Relative: 15 %
Lymphs Abs: 0.9 10*3/uL (ref 0.7–4.0)
MCH: 30.5 pg (ref 26.0–34.0)
MCHC: 35.1 g/dL (ref 30.0–36.0)
MCV: 86.8 fL (ref 80.0–100.0)
Monocytes Absolute: 1.2 10*3/uL — ABNORMAL HIGH (ref 0.1–1.0)
Monocytes Relative: 21 %
Neutro Abs: 3.5 10*3/uL (ref 1.7–7.7)
Neutrophils Relative %: 61 %
Platelets: 149 10*3/uL — ABNORMAL LOW (ref 150–400)
RBC: 4.03 MIL/uL — ABNORMAL LOW (ref 4.22–5.81)
RDW: 12.8 % (ref 11.5–15.5)
WBC: 5.6 10*3/uL (ref 4.0–10.5)
nRBC: 0 % (ref 0.0–0.2)

## 2021-03-08 LAB — BASIC METABOLIC PANEL
Anion gap: 9 (ref 5–15)
Anion gap: 10 (ref 5–15)
Anion gap: 12 (ref 5–15)
BUN: 5 mg/dL — ABNORMAL LOW (ref 6–20)
BUN: 5 mg/dL — ABNORMAL LOW (ref 6–20)
BUN: 5 mg/dL — ABNORMAL LOW (ref 6–20)
CO2: 26 mmol/L (ref 22–32)
CO2: 28 mmol/L (ref 22–32)
CO2: 24 mmol/L (ref 22–32)
Calcium: 8.8 mg/dL — ABNORMAL LOW (ref 8.9–10.3)
Calcium: 9.1 mg/dL (ref 8.9–10.3)
Calcium: 8.9 mg/dL (ref 8.9–10.3)
Chloride: 95 mmol/L — ABNORMAL LOW (ref 98–111)
Chloride: 94 mmol/L — ABNORMAL LOW (ref 98–111)
Chloride: 93 mmol/L — ABNORMAL LOW (ref 98–111)
Creatinine, Ser: 0.61 mg/dL (ref 0.61–1.24)
Creatinine, Ser: 0.6 mg/dL — ABNORMAL LOW (ref 0.61–1.24)
Creatinine, Ser: 0.69 mg/dL (ref 0.61–1.24)
GFR, Estimated: >60 mL/min (ref >60)
GFR, Estimated: >60 mL/min (ref >60)
GFR, Estimated: >60 mL/min (ref >60)
Glucose, Bld: 148 mg/dL — ABNORMAL HIGH (ref 70–99)
Glucose, Bld: 153 mg/dL — ABNORMAL HIGH (ref 70–99)
Glucose, Bld: 210 mg/dL — ABNORMAL HIGH (ref 70–99)
Potassium: 3.2 mmol/L — ABNORMAL LOW (ref 3.5–5.1)
Potassium: 3.4 mmol/L — ABNORMAL LOW (ref 3.5–5.1)
Potassium: 3.4 mmol/L — ABNORMAL LOW (ref 3.5–5.1)
Sodium: 130 mmol/L — ABNORMAL LOW (ref 135–145)
Sodium: 132 mmol/L — ABNORMAL LOW (ref 135–145)
Sodium: 129 mmol/L — ABNORMAL LOW (ref 135–145)

## 2021-03-08 LAB — HEPATITIS PANEL, ACUTE
HCV Ab: NONREACTIVE
Hep A IgM: NONREACTIVE
Hep B C IgM: NONREACTIVE
Hepatitis B Surface Ag: NONREACTIVE

## 2021-03-08 LAB — HEPATIC FUNCTION PANEL
ALT: 369 U/L — ABNORMAL HIGH (ref 0–44)
ALT: 309 U/L — ABNORMAL HIGH (ref 0–44)
AST: 487 U/L — ABNORMAL HIGH (ref 15–41)
AST: 412 U/L — ABNORMAL HIGH (ref 15–41)
Albumin: 3.9 g/dL (ref 3.5–5.0)
Albumin: 3.1 g/dL — ABNORMAL LOW (ref 3.5–5.0)
Alkaline Phosphatase: 119 U/L (ref 38–126)
Alkaline Phosphatase: 99 U/L (ref 38–126)
Bilirubin, Direct: 1.1 mg/dL — ABNORMAL HIGH (ref 0.0–0.2)
Bilirubin, Direct: 1 mg/dL — ABNORMAL HIGH (ref 0.0–0.2)
Indirect Bilirubin: 1.5 mg/dL — ABNORMAL HIGH (ref 0.3–0.9)
Indirect Bilirubin: 1.1 mg/dL — ABNORMAL HIGH (ref 0.3–0.9)
Total Bilirubin: 2.6 mg/dL — ABNORMAL HIGH (ref 0.3–1.2)
Total Bilirubin: 2.1 mg/dL — ABNORMAL HIGH (ref 0.3–1.2)
Total Protein: 8.2 g/dL — ABNORMAL HIGH (ref 6.5–8.1)
Total Protein: 6.6 g/dL (ref 6.5–8.1)

## 2021-03-08 LAB — ACETAMINOPHEN LEVEL: Acetaminophen (Tylenol), Serum: <10 ug/mL — ABNORMAL LOW (ref 10–30)

## 2021-03-08 LAB — MAGNESIUM: Magnesium: 1.6 mg/dL — ABNORMAL LOW (ref 1.7–2.4)

## 2021-03-08 LAB — URINALYSIS, ROUTINE W REFLEX MICROSCOPIC
Bacteria, UA: NONE SEEN
Bilirubin Urine: NEGATIVE
Glucose, UA: NEGATIVE mg/dL
Ketones, ur: NEGATIVE mg/dL
Leukocytes,Ua: NEGATIVE
Nitrite: NEGATIVE
Protein, ur: NEGATIVE mg/dL
Specific Gravity, Urine: 1.004 — ABNORMAL LOW (ref 1.005–1.030)
pH: 8 (ref 5.0–8.0)

## 2021-03-08 LAB — HIV ANTIBODY (ROUTINE TESTING W REFLEX): HIV Screen 4th Generation wRfx: NONREACTIVE

## 2021-03-08 LAB — LIPASE, BLOOD: Lipase: 93 U/L — ABNORMAL HIGH (ref 11–51)

## 2021-03-08 LAB — OSMOLALITY, URINE: Osmolality, Ur: 70 mOsm/kg — ABNORMAL LOW (ref 300–900)

## 2021-03-08 LAB — SODIUM, URINE, RANDOM: Sodium, Ur: <10 mmol/L

## 2021-03-08 LAB — MRSA NEXT GEN BY PCR, NASAL: MRSA by PCR Next Gen: NOT DETECTED

## 2021-03-08 LAB — PROTIME-INR
INR: 1.1 (ref 0.8–1.2)
Prothrombin Time: 13.8 seconds (ref 11.4–15.2)

## 2021-03-08 LAB — ETHANOL: Alcohol, Ethyl (B): <10 mg/dL (ref <10)

## 2021-03-08 LAB — AMMONIA: Ammonia: 108 umol/L — ABNORMAL HIGH (ref 9–35)

## 2021-03-08 LAB — PHOSPHORUS: Phosphorus: 2.5 mg/dL (ref 2.5–4.6)

## 2021-03-08 LAB — SARS CORONAVIRUS 2 (TAT 6-24 HRS): SARS Coronavirus 2: NEGATIVE

## 2021-03-08 LAB — OSMOLALITY: Osmolality: 268 mOsm/kg — ABNORMAL LOW (ref 275–295)

## 2021-03-08 MED ORDER — LORAZEPAM 1 MG PO TABS: 1.0000 mg | ORAL_TABLET | ORAL | Status: DC | PRN

## 2021-03-08 MED ORDER — FOLIC ACID 1 MG PO TABS
1.0000 mg | ORAL_TABLET | Freq: Every day | ORAL | Status: DC
  Administered 2021-03-08: 1 mg via ORAL
  Filled 2021-03-08: qty 1

## 2021-03-08 MED ORDER — THIAMINE HCL 100 MG/ML IJ SOLN
500.0000 mg | Freq: Three times a day (TID) | INTRAVENOUS | Status: AC
  Administered 2021-03-08 – 2021-03-09 (×6): 500 mg via INTRAVENOUS
  Filled 2021-03-08 (×7): qty 5

## 2021-03-08 MED ORDER — ONDANSETRON HCL 4 MG/2ML IJ SOLN: 4.0000 mg | Freq: Four times a day (QID) | INTRAMUSCULAR | Status: DC | PRN

## 2021-03-08 MED ORDER — PANTOPRAZOLE SODIUM 40 MG IV SOLR
40.0000 mg | Freq: Every day | INTRAVENOUS | Status: DC
  Administered 2021-03-08: 40 mg via INTRAVENOUS
  Filled 2021-03-08: qty 40

## 2021-03-08 MED ORDER — LACTATED RINGERS IV SOLN: INTRAVENOUS | Status: DC

## 2021-03-08 MED ORDER — LACTATED RINGERS IV BOLUS
1000.0000 mL | Freq: Once | INTRAVENOUS | Status: AC
  Administered 2021-03-08: 1000 mL via INTRAVENOUS

## 2021-03-08 MED ORDER — POTASSIUM CHLORIDE 10 MEQ/100ML IV SOLN
10.0000 meq | INTRAVENOUS | Status: AC
  Administered 2021-03-08 (×3): 10 meq via INTRAVENOUS
  Filled 2021-03-08 (×4): qty 100

## 2021-03-08 MED ORDER — INFLUENZA VAC SPLIT QUAD 0.5 ML IM SUSY
0.5000 mL | PREFILLED_SYRINGE | INTRAMUSCULAR | Status: DC
  Filled 2021-03-08: qty 0.5

## 2021-03-08 MED ORDER — DOCUSATE SODIUM 100 MG PO CAPS: 100.0000 mg | ORAL_CAPSULE | Freq: Two times a day (BID) | ORAL | Status: DC | PRN

## 2021-03-08 MED ORDER — LACTATED RINGERS IV BOLUS: 1000.0000 mL | Freq: Once | INTRAVENOUS | Status: DC

## 2021-03-08 MED ORDER — PNEUMOCOCCAL VAC POLYVALENT 25 MCG/0.5ML IJ INJ
0.5000 mL | INJECTION | INTRAMUSCULAR | Status: DC
Start: 2021-03-09 — End: 2021-03-13
  Filled 2021-03-08: qty 0.5

## 2021-03-08 MED ORDER — SODIUM CHLORIDE 0.9 % IV SOLN
1.0000 mg | Freq: Every day | INTRAVENOUS | Status: DC
  Administered 2021-03-08: 1 mg via INTRAVENOUS
  Filled 2021-03-08 (×4): qty 0.2

## 2021-03-08 MED ORDER — SODIUM CHLORIDE 0.9 % IV BOLUS
1000.0000 mL | Freq: Once | INTRAVENOUS | Status: AC
  Administered 2021-03-08: 1000 mL via INTRAVENOUS

## 2021-03-08 MED ORDER — FOLIC ACID 5 MG/ML IJ SOLN
1.0000 mg | Freq: Every day | INTRAMUSCULAR | Status: DC
  Administered 2021-03-09: 1 mg via INTRAVENOUS
  Filled 2021-03-08: qty 0.2

## 2021-03-08 MED ORDER — SODIUM CHLORIDE 0.9 % IV SOLN: INTRAVENOUS | Status: DC

## 2021-03-08 MED ORDER — HEPARIN SODIUM (PORCINE) 5000 UNIT/ML IJ SOLN
5000.0000 [IU] | Freq: Three times a day (TID) | INTRAMUSCULAR | Status: DC
  Administered 2021-03-08 – 2021-03-13 (×15): 5000 [IU] via SUBCUTANEOUS
  Filled 2021-03-08 (×14): qty 1

## 2021-03-08 MED ORDER — HALOPERIDOL LACTATE 5 MG/ML IJ SOLN: 5.0000 mg | Freq: Once | INTRAMUSCULAR | Status: DC

## 2021-03-08 MED ORDER — ADULT MULTIVITAMIN W/MINERALS CH
1.0000 | ORAL_TABLET | Freq: Every day | ORAL | Status: DC
  Administered 2021-03-08 – 2021-03-13 (×6): 1 via ORAL
  Filled 2021-03-08 (×6): qty 1

## 2021-03-08 MED ORDER — LORAZEPAM 2 MG/ML IJ SOLN
1.0000 mg | INTRAMUSCULAR | Status: AC | PRN
  Administered 2021-03-08: 4 mg via INTRAVENOUS
  Administered 2021-03-10 – 2021-03-11 (×2): 2 mg via INTRAVENOUS
  Filled 2021-03-08 (×3): qty 1

## 2021-03-08 MED ORDER — CHLORHEXIDINE GLUCONATE CLOTH 2 % EX PADS
6.0000 | MEDICATED_PAD | Freq: Every day | CUTANEOUS | Status: DC
  Administered 2021-03-08 – 2021-03-13 (×6): 6 via TOPICAL

## 2021-03-08 MED ORDER — POLYETHYLENE GLYCOL 3350 17 G PO PACK: 17.0000 g | PACK | Freq: Every day | ORAL | Status: DC | PRN

## 2021-03-08 MED ORDER — LORAZEPAM 2 MG/ML IJ SOLN
1.0000 mg | INTRAMUSCULAR | Status: DC | PRN
  Administered 2021-03-08: 4 mg via INTRAVENOUS
  Administered 2021-03-08: 2 mg via INTRAVENOUS
  Filled 2021-03-08: qty 2
  Filled 2021-03-08: qty 1
  Filled 2021-03-08: qty 2

## 2021-03-08 MED ORDER — LACTULOSE 10 GM/15ML PO SOLN
30.0000 g | Freq: Three times a day (TID) | ORAL | Status: DC
  Administered 2021-03-08: 30 g via ORAL
  Filled 2021-03-08 (×3): qty 45

## 2021-03-08 MED ORDER — IOHEXOL 350 MG/ML SOLN
80.0000 mL | Freq: Once | INTRAVENOUS | Status: AC | PRN
  Administered 2021-03-08: 80 mL via INTRAVENOUS

## 2021-03-08 MED ORDER — DEXMEDETOMIDINE HCL IN NACL 400 MCG/100ML IV SOLN
0.4000 ug/kg/h | INTRAVENOUS | Status: DC
  Administered 2021-03-08: 0.4 ug/kg/h via INTRAVENOUS
  Administered 2021-03-09: 0.6 ug/kg/h via INTRAVENOUS
  Filled 2021-03-08 (×2): qty 100

## 2021-03-08 MED ORDER — LORAZEPAM 1 MG PO TABS: 1.0000 mg | ORAL_TABLET | ORAL | Status: AC | PRN

## 2021-03-08 MED ORDER — LACTULOSE ENEMA
300.0000 mL | Freq: Two times a day (BID) | ORAL | Status: DC
  Administered 2021-03-08 – 2021-03-09 (×2): 300 mL via RECTAL
  Filled 2021-03-08 (×2): qty 300

## 2021-03-08 NOTE — ED Notes (Signed)
Family at beside  

## 2021-03-08 NOTE — Progress Notes (Signed)
eLink Physician-Brief Progress Note Patient Name: Corey Anthony DOB: 10/03/1985 MRN: [id number]   Date of Service  03/08/2021  HPI/Events of Note  Bedside RN requesting Flexiseal to facilitate management of Lactulose induced diarrhea, as patient is somnolent.  eICU Interventions  Flexiseal ordered.        Thomasene Lot Dajai Wahlert 03/08/2021, 7:55 PM

## 2021-03-08 NOTE — Consult Note (Addendum)
[facility] Consult: 1:27 PM 03/08/2021  LOS: 0 days    Referring Provider: Dr Tacy Learn  Primary Care Physician:  Department, [facility] Primary Gastroenterologist:  unassigned   Pt does not speak English.  Hx obtained through translator and discussion with multiple family members.   Reason for Consultation: Alcoholic liver disease.   HPI: Corey Anthony is a 35 y.o. male.  PMH alcohol abuse.  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  Asthma.  10/2018 ORIF of dislocated left toe.  Patient drinks heavily.  Has become confused, disoriented, gait unstable for the last several days.  Apparently family confiscated his liquor almost a week ago.  Seen at [facility] urgent care on September 8 where they reported his last drink was that morning.  He was dizzy, sweating and NP suspected alcohol withdrawal syndrome.  He was sent to the ED and arrived but did not stay to be seen.  He had been having nonbloody emesis, right abdominal pain, lower extremity weakness for the last few weeks.  Family says he was not able to tolerate liquid or solid PO.  No bloody emesis.  No bloody or melenic stools.  When he presented yesterday he was complaining of right sided exertional chest pain.  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  Additionally complained of tingling in lower extremities, insomnia, PO associated nonbloody emesis.    Soon after arrival patient became increasingly confused to the point where language interpreter could not understand what he was saying.  Elevated diastolic blood pressures to 410 with systolic pressures in the 120s to 150s.  Heart rate in the 70s to 80s.  Oxygen sats on room air 100%, though 1 outlying reading  at noon today of 72%.  No fever.  Initially felt to have hepatic encephalopathy but MD now addressing likely DTs, alcohol withdrawal.  T bili 2.6 >> 2.1.  Alk phos 119 >> D9.  AST/ALT 47/369 >> 412/309.  Lipase 93.  Sodium 129.  Potassium 3.4.  No derangement of BUN/creatinine with GFR > 60. WBCs 5.6.  Hb 12.3.  Platelets 149K.  INR 1.1.  APAP level <10.  Leukosis 210. Acute hepatitis serologies negative and include hep A IgM, hep B surface Ag, hep B core IgM, HCV Ab. ETOH <10.   CTAP w contrast: Liver normal.  Unremarkable gallbladder, pancreas, spleen, stomach, large and small intestine. CT head: Unremarkable. Discriminant Fx score 6.3.    While in the ED patient became increasingly agitated, hallucinated, pulled out lines.  Received large doses of Ativan and then Precedex.  He is now somnolent.    Works in [employer].  Patient reported he drinks a lot, "whole bottle" of what sounds like whiskey or brandy daily.  Family says that he used to snort ice, methamphetamine, when he lived in [place] but has not used that for some period of time.  Lives with his sister.  Family history of alcohol related liver disease leading to death of his  maternal grandfather.    Past Medical History:  Diagnosis Date   Alcohol abuse 02/2021   Asthma     Past Surgical History:  Procedure Laterality Date   CLOSED REDUCTION METATARSAL Left 11/14/2018   Procedure: CLOSED REDUCTION METATARSAL 5TH WITH IRRIGATION AND DEBRIDEMENT;  Surgeon: Leandrew Koyanagi, MD;  Location: [place];  Service: Orthopedics;  Laterality: Left;  BLOCK IN PREOP   ORIF TOE FRACTURE Left 11/07/2018   Procedure: OPEN REDUCTION PERCUTANEOUS PINNING LEFT 3RD TOE PROXIMAL PHALANX, IRRIGATION AND DEBRIDEMENT LEFT 5TH TOE DISLOCATION;  Surgeon: Leandrew Koyanagi, MD;  Location: [place];  Service: Orthopedics;  Laterality: Left;    Prior to Admission medications   Medication Sig Start Date End Date Taking?  Authorizing Provider  diphenhydrAMINE-PE-APAP 12.5-5-325 MG/15ML LIQD Take 15 mLs by mouth every 6 (six) hours as needed (flu symptoms, coughing).   Yes [provider]    Scheduled Meds:  heparin injection (subcutaneous)  5,000 Units Subcutaneous Q8H   lactulose  30 g Oral TID   multivitamin with minerals  1 tablet Oral Daily   pantoprazole (PROTONIX) IV  40 mg Intravenous QHS   Infusions:  dexmedetomidine (PRECEDEX) IV infusion     folic acid (FOLVITE) IVPB     lactated ringers     lactated ringers     thiamine injection Stopped (03/08/21 1211)   PRN Meds: docusate sodium, LORazepam **OR** LORazepam, ondansetron (ZOFRAN) IV, polyethylene glycol   Allergies as of 03/07/2021   (No Known Allergies)    Family History  Problem Relation Age of Onset   Diabetes Mellitus II Neg Hx     Social History   Socioeconomic History   Marital status: Single    Spouse name: Not on file   Number of children: Not on file   Years of education: Not on file   Highest education level: Not on file  Occupational History   Not on file  Tobacco Use   Smoking status: Never   Smokeless tobacco: Never  Vaping Use   Vaping Use: Never used  Substance and Sexual Activity   Alcohol use: Yes   Drug use: Not Currently   Sexual activity: Not on file  Other Topics Concern   Not on file  Social History Narrative   Not on file   Social Determinants of Health   Financial Resource Strain: Not on file  Food Insecurity: Not on file  Transportation Needs: Not on file  Physical Activity: Not on file  Stress: Not on file  Social Connections: Not on file  Intimate Partner Violence: Not on file    REVIEW OF SYSTEMS: See HPI.  Unable to communicate with patient so all history details and ROS provided by family.   PHYSICAL EXAM: Vital signs in last 24 hours: Vitals:   03/08/21 1240 03/08/21 1300  BP: 103/64 (!) 128/107  Pulse: 84 75  Resp: 16 18  Temp:    SpO2: (!) 72% 100%   Wt  Readings from Last 3 Encounters:  03/08/21 54.4 kg  11/14/18 54.4 kg  11/07/18 53.5 kg    General: Patient somnolent.  Snoring.  Sallow vs mild jaundice.   Head: No facial asymmetry or swelling.  No signs of head trauma. Eyes: Injected sclera.  No scleral icterus.  Conjunctiva pink. Ears: Unable to assess hearing Nose: No nasal discharge. Mouth: Unable to widely open mouth but observed poor dentition, pink mucosa.  Resting tongue at midline.  No blood. Neck: No JVD.  Lungs: No labored breathing.  No cough. Heart: RRR.  No MRG.  S1, S2 present. Abdomen: Soft without distention.  Active bowel sounds.  No tenderness.  No HSM, masses, bruits, hernias.   Rectal: Furred. Musc/Skeltl: No joint redness, swelling or gross deformity. Extremities: No CCE. Neurologic: Sedated.  No tremors. Skin: No telangiectasia, sores, rashes, suspicious lesions. Nodes: No CCE.  Intake/Output from previous day: 09/12 0701 - 09/13 0700 In: 1000 [IV Piggyback:1000] Out: -  Intake/Output this shift: No intake/output data recorded.  LAB RESULTS: Recent Labs    03/07/21 1131 03/08/21 0219  WBC 6.7 5.6  HGB 14.4 12.3*  HCT 40.3 35.0*  PLT 159 149*   BMET Lab Results  Component Value Date   NA 129 (L) 03/08/2021   NA 132 (L) 03/08/2021   NA 130 (L) 03/08/2021   K 3.4 (L) 03/08/2021   K 3.4 (L) 03/08/2021   K 3.2 (L) 03/08/2021   CL 93 (L) 03/08/2021   CL 94 (L) 03/08/2021   CL 95 (L) 03/08/2021   CO2 24 03/08/2021   CO2 28 03/08/2021   CO2 26 03/08/2021   GLUCOSE 210 (H) 03/08/2021   GLUCOSE 153 (H) 03/08/2021   GLUCOSE 148 (H) 03/08/2021   BUN 5 (L) 03/08/2021   BUN 5 (L) 03/08/2021   BUN 5 (L) 03/08/2021   CREATININE 0.69 03/08/2021   CREATININE 0.60 (L) 03/08/2021   CREATININE 0.61 03/08/2021   CALCIUM 8.9 03/08/2021   CALCIUM 9.1 03/08/2021   CALCIUM 8.8 (L) 03/08/2021   LFT Recent Labs    03/07/21 2035 03/08/21 0219  PROT 8.2* 6.6  ALBUMIN 3.9 3.1*  AST 487* 412*  ALT  369* 309*  ALKPHOS 119 99  BILITOT 2.6* 2.1*  BILIDIR 1.1* 1.0*  IBILI 1.5* 1.1*   PT/INR Lab Results  Component Value Date   INR 1.1 03/08/2021   Hepatitis Panel Recent Labs    03/08/21 0219  HEPBSAG NON REACTIVE  HCVAB NON REACTIVE  HEPAIGM NON REACTIVE  HEPBIGM NON REACTIVE   C-Diff No components found for: CDIFF Lipase     Component Value Date/Time   LIPASE 93 (H) 03/08/2021 0219    Drugs of Abuse  No results found for: LABOPIA, COCAINSCRNUR, LABBENZ, AMPHETMU, THCU, LABBARB   RADIOLOGY STUDIES: DG Ribs Unilateral W/Chest Right  Result Date: 03/07/2021 CLINICAL DATA:  Rib injury EXAM: RIGHT RIBS AND CHEST - 3+ VIEW COMPARISON:  None. FINDINGS: No pneumothorax or blunting of the costophrenic angles to suggest pleural effusion. Cardiac and mediastinal margins appear normal. The lungs appear clear. No pneumothorax. Marker noted projecting over the right lateral ribs in the vicinity of the fifth and sixth ribs. No discrete rib discontinuity is identified. IMPRESSION: 1. No well-defined rib injury is identified. The lungs appear clear. Electronically Signed   By: Van Clines M.D.   On: 03/07/2021 12:06   CT HEAD WO CONTRAST (5MM)  Result Date: 03/07/2021 CLINICAL DATA:  Mental status change. EXAM: CT HEAD WITHOUT CONTRAST TECHNIQUE: Contiguous axial images were obtained from the base of the skull through the vertex without intravenous contrast. COMPARISON:  CT head 04/05/2020. FINDINGS: Brain: No evidence of acute infarction, hemorrhage, hydrocephalus, extra-axial collection or mass lesion/mass effect. Vascular: No hyperdense vessel or unexpected calcification. Skull: Normal. Negative for fracture or focal lesion. Sinuses/Orbits: No acute finding. Other: None. IMPRESSION: No acute intracranial abnormality. Electronically Signed   By: Ronney Asters M.D.   On: 03/07/2021 21:50   CT ABDOMEN PELVIS W CONTRAST  Result Date:  03/08/2021 CLINICAL DATA:  Abdominal pain EXAM: CT  ABDOMEN AND PELVIS WITH CONTRAST TECHNIQUE: Multidetector CT imaging of the abdomen and pelvis was performed using the standard protocol following bolus administration of intravenous contrast. CONTRAST:  61m OMNIPAQUE IOHEXOL 350 MG/ML SOLN COMPARISON:  None. FINDINGS: Lower chest: Lung bases are clear. Hepatobiliary: Liver is within normal limits. Gallbladder is unremarkable. No intrahepatic or extrahepatic ductal dilatation. Pancreas: Within normal limits. Spleen: Within normal limits. Adrenals/Urinary Tract: Adrenal glands are within normal limits. Kidneys are within normal limits.  No hydronephrosis. Bladder is within normal limits. Stomach/Bowel: Stomach is within normal limits. No evidence of bowel obstruction. Normal appendix (coronal image 46). No colonic wall thickening or inflammatory changes. Vascular/Lymphatic: No evidence of abdominal aortic aneurysm. No suspicious abdominopelvic lymphadenopathy. Reproductive: Prostate is unremarkable. Other: No abdominopelvic ascites. Musculoskeletal: Visualized osseous structures are within normal limits. IMPRESSION: Negative CT abdomen/pelvis. Electronically Signed   By: SJulian HyM.D.   On: 03/08/2021 03:40   DG CHEST PORT 1 VIEW  Result Date: 03/08/2021 CLINICAL DATA:  Shortness of breath EXAM: PORTABLE CHEST 1 VIEW COMPARISON:  03/07/2021 FINDINGS: The heart size and mediastinal contours are within normal limits. Both lungs are clear. The visualized skeletal structures are unremarkable. IMPRESSION: No active disease. Electronically Signed   By: KUlyses JarredM.D.   On: 03/08/2021 02:58      IMPRESSION:      ETOH hepatitis.  DF score 6.3.  CTAP unrevealing w normal looking liver.   Apparently last ETOH was Wed 9/7.    Confusion progressing to agitation.  Ammonia moderately elevated.  So suspect HE and/or ETOH withdrawal.  INR, normal, Platelets initially normal _0  but down to 149 on recheck.  Lactulose  enemas ordered    Hypokalemia.       Hyponatremia.      PLAN:       AMA. ASMA,mitochondrial Abs, anti LKM Abs, IgG, ferritin, iron, TIBC, Ceruloplasmin, A1AT, U/A.    Hold off on feeding patient until mental status improves.  Current IV fluid support of LR at 125/hour.  If patient's mental status slow to improve, consider core track placement for administration of meds and feeding.  Proceed with administration of lactulose enemas bid, empiric Protonix 40 mg iv BID.   SAzucena Freed 03/08/2021, 1:27 PM Phone [phone]

## 2021-03-08 NOTE — ED Notes (Signed)
Placed on 2 L saturations 86

## 2021-03-08 NOTE — ED Notes (Signed)
Pt ambulatory to restroom with steady gait.

## 2021-03-08 NOTE — Progress Notes (Signed)
PROGRESS NOTE    Corey Anthony  XQJ:[id number] DOB: 1986/03/15 DOA: 03/07/2021 PCP: Department, [facility]      Brief Narrative:  Corey Anthony is a 35 y.o. M with no significant PMHx who presented with confusion.  Caveat that patient is altered and only history is available from ED and admitting MD notes.    Evidently drinks "a bottle" of alcohol daily, quit in the last few days and became confused, ataxic so family brought him to the ER.    In the ER, AST/ALT >400, ammonia 108, Tbili 2.1, INR 1.1, Na 126, Cr normal.  Very encephalopathic.  Initial suspicion was for a hypoactive encephalopathy like HE with a component of Wernicke, and he was started on lactulose, high dose IV thiamine.  This morning, he has become more delirious, agitated, and appears to be withdrawing from alcohol.  GI consulted for hepatitis.  CCM consulted for worsening delirium.     Assessment & Plan:  Delirium Seems multifactorial.  The agitation, hallucination, and sweating observed this morning are more consistent with alcohol withdrawal and DTs.  There is certainly a component of hepatic encephaloapthy and Wernickes.  - Consult CCM for ICU managmeent of delirium tremens - Continue High dose thiamine IV - Continue lactulose    Transaminitis Uncertain if this is hepatitis or decompensated cirrhosis.  MELD 21, Maddrey very low 3. - Follow serologies - Consult GI, appreciate cares   Hyponatremia  Hypokalemia - Supplement K       Disposition: Status is: Inpatient  Remains inpatient appropriate because:Altered mental status  Dispo: The patient is from: Home              Anticipated d/c is to:  TBD              Patient currently is not medically stable to d/c.   Difficult to place patient No              MDM: This is a no charge note.  For further details, please see H&P by my partner Dr. Toniann Fail from earlier today.  The below labs and imaging reports were reviewed and  summarized above.    DVT prophylaxis: heparin injection 5,000 Units Start: 03/08/21 1400 SCDs Start: 03/08/21 1254 SCDs Start: 03/08/21 0219        Objective: Vitals:   03/08/21 1000 03/08/21 1215 03/08/21 1240 03/08/21 1300  BP: (!) 133/104 (!) 126/107 103/64 (!) 128/107  Pulse: 76 77 84 75  Resp: 16 19 16 18   Temp:      TempSrc:      SpO2: 100% 100% (!) 72% 100%  Weight:   54.4 kg     Intake/Output Summary (Last 24 hours) at 03/08/2021 1422 Last data filed at 03/07/2021 2332 Gross per 24 hour  Intake 1000 ml  Output --  Net 1000 ml   Filed Weights   03/08/21 1240  Weight: 54.4 kg    Examination: The patient was seen and examined.    He remains very confused, agitated, sweating.  WIll consult CCM.    Data Reviewed: I have personally reviewed following labs and imaging studies:  CBC: Recent Labs  Lab 03/07/21 1131 03/08/21 0219  WBC 6.7 5.6  NEUTROABS 4.6 3.5  HGB 14.4 12.3*  HCT 40.3 35.0*  MCV 85.2 86.8  PLT 159 149*   Basic Metabolic Panel: Recent Labs  Lab 03/07/21 1131 03/08/21 0219 03/08/21 0755 03/08/21 0848  NA 124* 130* 132* 129*  K 2.9* 3.2* 3.4* 3.4*  CL 82* 95* 94* 93*  CO2 25 26 28 24   GLUCOSE 129* 148* 153* 210*  BUN 5* 5* 5* 5*  CREATININE 0.57* 0.61 0.60* 0.69  CALCIUM 9.7 8.8* 9.1 8.9   GFR: CrCl cannot be calculated (Unknown ideal weight.). Liver Function Tests: Recent Labs  Lab 03/07/21 2035 03/08/21 0219  AST 487* 412*  ALT 369* 309*  ALKPHOS 119 99  BILITOT 2.6* 2.1*  PROT 8.2* 6.6  ALBUMIN 3.9 3.1*   Recent Labs  Lab 03/08/21 0219  LIPASE 93*   Recent Labs  Lab 03/07/21 2036  AMMONIA 108*   Coagulation Profile: Recent Labs  Lab 03/08/21 0219  INR 1.1   Cardiac Enzymes: No results for input(s): CKTOTAL, CKMB, CKMBINDEX, TROPONINI in the last 168 hours. BNP (last 3 results) No results for input(s): PROBNP in the last 8760 hours. HbA1C: No results for input(s): HGBA1C in the last 72  hours. CBG: No results for input(s): GLUCAP in the last 168 hours. Lipid Profile: No results for input(s): CHOL, HDL, LDLCALC, TRIG, CHOLHDL, LDLDIRECT in the last 72 hours. Thyroid Function Tests: No results for input(s): TSH, T4TOTAL, FREET4, T3FREE, THYROIDAB in the last 72 hours. Anemia Panel: No results for input(s): VITAMINB12, FOLATE, FERRITIN, TIBC, IRON, RETICCTPCT in the last 72 hours. Urine analysis: No results found for: COLORURINE, APPEARANCEUR, LABSPEC, PHURINE, GLUCOSEU, HGBUR, BILIRUBINUR, KETONESUR, PROTEINUR, UROBILINOGEN, NITRITE, LEUKOCYTESUR Sepsis Labs: @LABRCNTIP (procalcitonin:4,lacticacidven:4)  ) Recent Results (from the past 240 hour(s))  SARS CORONAVIRUS 2 (TAT 6-24 HRS) Nasopharyngeal Nasopharyngeal Swab     Status: None   Collection Time: 03/08/21  3:00 AM   Specimen: Nasopharyngeal Swab  Result Value Ref Range Status   SARS Coronavirus 2 NEGATIVE NEGATIVE Final    Comment: (NOTE) SARS-CoV-2 target nucleic acids are NOT DETECTED.  The SARS-CoV-2 RNA is generally detectable in upper and lower respiratory specimens during the acute phase of infection. Negative results do not preclude SARS-CoV-2 infection, do not rule out co-infections with other pathogens, and should not be used as the sole basis for treatment or other patient management decisions. Negative results must be combined with clinical observations, patient history, and epidemiological information. The expected result is Negative.  Fact Sheet for Patients:  Fact Sheet for Healthcare Providers: 03/10/21  This test is not yet approved or cleared by the [employer] FDA and  has been authorized for detection and/or diagnosis of SARS-CoV-2 by FDA under an Emergency Use Authorization (EUA). This EUA will remain  in effect (meaning this test can be used) for the duration of the COVID-19 declaration under Se ction  564(b)(1) of the Act, 21 U.S.C. section 360bbb-3(b)(1), unless the authorization is terminated or revoked sooner.  Performed at [facility], [address], [place], [address] [place]          Radiology Studies: DG Ribs Unilateral W/Chest Right  Result Date: 03/07/2021 CLINICAL DATA:  Rib injury EXAM: RIGHT RIBS AND CHEST - 3+ VIEW COMPARISON:  None. FINDINGS: No pneumothorax or blunting of the costophrenic angles to suggest pleural effusion. Cardiac and mediastinal margins appear normal. The lungs appear clear. No pneumothorax. Marker noted projecting over the right lateral ribs in the vicinity of the fifth and sixth ribs. No discrete rib discontinuity is identified. IMPRESSION: 1. No well-defined rib injury is identified. The lungs appear clear. Electronically Signed   By: [id number] M.D.   On: 03/07/2021 12:06   CT HEAD WO CONTRAST (Gaylyn Rong)  Result Date: 03/07/2021 CLINICAL DATA:  Mental status change.  EXAM: CT HEAD WITHOUT CONTRAST TECHNIQUE: Contiguous axial images were obtained from the base of the skull through the vertex without intravenous contrast. COMPARISON:  CT head 04/05/2020. FINDINGS: Brain: No evidence of acute infarction, hemorrhage, hydrocephalus, extra-axial collection or mass lesion/mass effect. Vascular: No hyperdense vessel or unexpected calcification. Skull: Normal. Negative for fracture or focal lesion. Sinuses/Orbits: No acute finding. Other: None. IMPRESSION: No acute intracranial abnormality. Electronically Signed   By: Darliss Cheney M.D.   On: 03/07/2021 21:50   CT ABDOMEN PELVIS W CONTRAST  Result Date: 03/08/2021 CLINICAL DATA:  Abdominal pain EXAM: CT ABDOMEN AND PELVIS WITH CONTRAST TECHNIQUE: Multidetector CT imaging of the abdomen and pelvis was performed using the standard protocol following bolus administration of intravenous contrast. CONTRAST:  53mL OMNIPAQUE IOHEXOL 350 MG/ML SOLN COMPARISON:  None. FINDINGS: Lower chest: Lung bases are  clear. Hepatobiliary: Liver is within normal limits. Gallbladder is unremarkable. No intrahepatic or extrahepatic ductal dilatation. Pancreas: Within normal limits. Spleen: Within normal limits. Adrenals/Urinary Tract: Adrenal glands are within normal limits. Kidneys are within normal limits.  No hydronephrosis. Bladder is within normal limits. Stomach/Bowel: Stomach is within normal limits. No evidence of bowel obstruction. Normal appendix (coronal image 46). No colonic wall thickening or inflammatory changes. Vascular/Lymphatic: No evidence of abdominal aortic aneurysm. No suspicious abdominopelvic lymphadenopathy. Reproductive: Prostate is unremarkable. Other: No abdominopelvic ascites. Musculoskeletal: Visualized osseous structures are within normal limits. IMPRESSION: Negative CT abdomen/pelvis. Electronically Signed   By: Charline Bills M.D.   On: 03/08/2021 03:40   DG CHEST PORT 1 VIEW  Result Date: 03/08/2021 CLINICAL DATA:  Shortness of breath EXAM: PORTABLE CHEST 1 VIEW COMPARISON:  03/07/2021 FINDINGS: The heart size and mediastinal contours are within normal limits. Both lungs are clear. The visualized skeletal structures are unremarkable. IMPRESSION: No active disease. Electronically Signed   By: Deatra Robinson M.D.   On: 03/08/2021 02:58        Scheduled Meds:  Chlorhexidine Gluconate Cloth  6 each Topical Daily   heparin injection (subcutaneous)  5,000 Units Subcutaneous Q8H   lactulose  300 mL Rectal BID   multivitamin with minerals  1 tablet Oral Daily   pantoprazole (PROTONIX) IV  40 mg Intravenous QHS   Continuous Infusions:  dexmedetomidine (PRECEDEX) IV infusion 0.4 mcg/kg/hr (03/08/21 1359)   folic acid (FOLVITE) IVPB     lactated ringers     lactated ringers     potassium chloride     thiamine injection Stopped (03/08/21 1211)     LOS: 0 days        Alberteen Sam, MD [facility] 03/08/2021, 2:22 PM     Please page though AMION or Epic  secure chat:  For password, contact charge nurse

## 2021-03-08 NOTE — H&P (Signed)
History and Physical    Corey Anthony ZOX:[id number] DOB: 1985-12-09 DOA: 03/07/2021  PCP: Department, [facility]  Patient coming from: Home.  Burmese interpreter used.  History obtained from patient's sister as patient appears confused.  Chief Complaint: Confusion.  Nausea vomiting.  HPI: Corey Anthony is a 35 y.o. male with history of alcoholism and history of asthma presently not on any medication and has been having increasing confusion with nausea vomiting and epigastric discomfort for the last 6 days.  Has been throwing up multiple times unable to keep anything.  Denies any blood in the vomitus.  Pain is mostly epigastric area nonradiating.  Denies any shortness of breath or chest pain.  ED Course: In the ER CT of the head was unremarkable.  EKG shows normal sinus rhythm with prolonged QTC and alcohol level was negative.  CBC unremarkable with sodium being 124 and potassium 2.9.  Patient's AST was 47 ALT 369 total bilirubin was 2.6 ammonia 108.  Serum osmolality was 268 and urine osmolality is 70 with urine sodium of less than 10.  Patient was given thiamine in the ER along with fluids.  Admitted for further work-up of acute encephalopathy with nausea vomiting and epigastric pain.  When I examined the patient patient is only oriented to his name and states that he is [place].  Review of Systems: As per HPI, rest all negative.   Past Medical History:  Diagnosis Date   Asthma     Past Surgical History:  Procedure Laterality Date   CLOSED REDUCTION METATARSAL Left 11/14/2018   Procedure: CLOSED REDUCTION METATARSAL 5TH WITH IRRIGATION AND DEBRIDEMENT;  Surgeon: Tarry Kos, MD;  Location: [facility];  Service: Orthopedics;  Laterality: Left;  BLOCK IN PREOP   ORIF TOE FRACTURE Left 11/07/2018   Procedure: OPEN REDUCTION PERCUTANEOUS PINNING LEFT 3RD TOE PROXIMAL PHALANX, IRRIGATION AND DEBRIDEMENT LEFT 5TH TOE DISLOCATION;  Surgeon: Tarry Kos, MD;  Location: [facility];  Service: Orthopedics;  Laterality: Left;     reports that he has never smoked. He has never used smokeless tobacco. He reports current alcohol use. He reports that he does not currently use drugs.  No Known Allergies  Family History  Problem Relation Age of Onset   Diabetes Mellitus II Neg Hx     Prior to Admission medications   Medication Sig Start Date End Date Taking? Authorizing Provider  cephALEXin (KEFLEX) 500 MG capsule Take 1 capsule (500 mg total) by mouth 4 (four) times daily. 11/06/18   Tarry Kos, MD  cetirizine (ZYRTEC) 10 MG tablet Take 1 tablet (10 mg total) by mouth daily. 09/11/18   Cathie Hoops, Amy V, PA-C  HYDROcodone-acetaminophen (NORCO/VICODIN) 5-325 MG tablet Take 1-2 tablets by mouth every 6 (six) hours as needed. 10/20/18   Aviva Kluver B, PA-C  mupirocin ointment (BACTROBAN) 2 % Apply 1 application topically 2 (two) times daily. 11/06/18   Tarry Kos, MD  Olopatadine HCl 0.2 % SOLN Apply 1 drop to eye daily. 09/11/18   Cathie Hoops, Amy V, PA-C  ondansetron (ZOFRAN) 4 MG tablet Take 1-2 tablets (4-8 mg total) by mouth every 8 (eight) hours as needed for nausea or vomiting. 11/14/18   Tarry Kos, MD  OVER THE COUNTER MEDICATION "nose spray"    [provider]  oxyCODONE-acetaminophen (PERCOCET) 5-325 MG tablet Take 1-2 tablets by mouth every 6 (six) hours as needed for severe pain. 11/14/18   Tarry Kos, MD  traMADol Janean Sark) 50  MG tablet Take 1 tablet (50 mg total) by mouth 3 (three) times daily as needed. 01/23/19   Cristie Hem, PA-C    Physical Exam: Constitutional: Moderately built and nourished. Vitals:   03/07/21 1026 03/07/21 1412 03/07/21 1720 03/07/21 2215  BP: 135/88 (!) 144/95 138/90 (!) 144/98  Pulse: 80 75 72 66  Resp: 17 16 18 14   Temp: 98.4 F (36.9 C)     SpO2: 98% 100% 99% 100%   Eyes: Anicteric no pallor. ENMT: No discharge from the ears eyes nose and mouth. Neck: No mass felt.  No neck rigidity. Respiratory: No  rhonchi or crepitations. Cardiovascular: S1-S2 heard. Abdomen: Soft epigastric tenderness present no guarding or rigidity. Musculoskeletal: No edema. Skin: No rash. Neurologic: Alert awake oriented to his name only.  Moving all extremities. Psychiatric: Appears confused.   Labs on Admission: I have personally reviewed following labs and imaging studies  CBC: Recent Labs  Lab 03/07/21 1131  WBC 6.7  NEUTROABS 4.6  HGB 14.4  HCT 40.3  MCV 85.2  PLT 159   Basic Metabolic Panel: Recent Labs  Lab 03/07/21 1131  NA 124*  K 2.9*  CL 82*  CO2 25  GLUCOSE 129*  BUN 5*  CREATININE 0.57*  CALCIUM 9.7   GFR: CrCl cannot be calculated (Unknown ideal weight.). Liver Function Tests: Recent Labs  Lab 03/07/21 2035  AST 487*  ALT 369*  ALKPHOS 119  BILITOT 2.6*  PROT 8.2*  ALBUMIN 3.9   No results for input(s): LIPASE, AMYLASE in the last 168 hours. Recent Labs  Lab 03/07/21 2036  AMMONIA 108*   Coagulation Profile: No results for input(s): INR, PROTIME in the last 168 hours. Cardiac Enzymes: No results for input(s): CKTOTAL, CKMB, CKMBINDEX, TROPONINI in the last 168 hours. BNP (last 3 results) No results for input(s): PROBNP in the last 8760 hours. HbA1C: No results for input(s): HGBA1C in the last 72 hours. CBG: No results for input(s): GLUCAP in the last 168 hours. Lipid Profile: No results for input(s): CHOL, HDL, LDLCALC, TRIG, CHOLHDL, LDLDIRECT in the last 72 hours. Thyroid Function Tests: No results for input(s): TSH, T4TOTAL, FREET4, T3FREE, THYROIDAB in the last 72 hours. Anemia Panel: No results for input(s): VITAMINB12, FOLATE, FERRITIN, TIBC, IRON, RETICCTPCT in the last 72 hours. Urine analysis: No results found for: COLORURINE, APPEARANCEUR, LABSPEC, PHURINE, GLUCOSEU, HGBUR, BILIRUBINUR, KETONESUR, PROTEINUR, UROBILINOGEN, NITRITE, LEUKOCYTESUR Sepsis Labs: @LABRCNTIP (procalcitonin:4,lacticidven:4) )No results found for this or any previous  visit (from the past 240 hour(s)).   Radiological Exams on Admission: DG Ribs Unilateral W/Chest Right  Result Date: 03/07/2021 CLINICAL DATA:  Rib injury EXAM: RIGHT RIBS AND CHEST - 3+ VIEW COMPARISON:  None. FINDINGS: No pneumothorax or blunting of the costophrenic angles to suggest pleural effusion. Cardiac and mediastinal margins appear normal. The lungs appear clear. No pneumothorax. Marker noted projecting over the right lateral ribs in the vicinity of the fifth and sixth ribs. No discrete rib discontinuity is identified. IMPRESSION: 1. No well-defined rib injury is identified. The lungs appear clear. Electronically Signed   By: M.D.   On: 03/07/2021 12:06   CT HEAD WO CONTRAST (Gaylyn Rong)  Result Date: 03/07/2021 CLINICAL DATA:  Mental status change. EXAM: CT HEAD WITHOUT CONTRAST TECHNIQUE: Contiguous axial images were obtained from the base of the skull through the vertex without intravenous contrast. COMPARISON:  CT head 04/05/2020. FINDINGS: Brain: No evidence of acute infarction, hemorrhage, hydrocephalus, extra-axial collection or mass lesion/mass effect. Vascular: No hyperdense vessel or unexpected  calcification. Skull: Normal. Negative for fracture or focal lesion. Sinuses/Orbits: No acute finding. Other: None. IMPRESSION: No acute intracranial abnormality. Electronically Signed   By: Darliss Cheney M.D.   On: 03/07/2021 21:50    EKG: Independently reviewed.  Normal sinus rhythm prolonged QTC.  Assessment/Plan Principal Problem:   Acute encephalopathy Active Problems:   Hyponatremia   Elevated LFTs   Epigastric pain   Alcohol abuse    Acute encephalopathy -on questioning patient thinks he is in [place].  He does not know the date and time.  Patient's ammonia levels elevated at 108 and also has some nystagmus.  I will keep patient on lactulose for the elevated lactulose and I will order thiamine 500 mg IV every 8 hourly for possible Wernicke's encephalopathy.   Follow metabolic panel and ammonia levels. Epigastric pain with nausea vomiting we will get a CT scan check lipase levels.  Clear liquids for now. Hyponatremia and hypokalemia could be from dehydration and vomiting.  Gently hydrate and follow metabolic panel serially.  Follow sodium trends. Alcohol abuse on CIWA protocol.  Wait about quitting. Elevated LFTs could be from alcoholic hepatitis.  We will check Tylenol levels and acute hepatitis panel.  Follow LFTs follow CT scan.  Check INR.  COVID test is pending.     DVT prophylaxis: SCDs for now until we get INR levels. Code Status: Full code. Family Communication: Patient's sister. Disposition Plan: To be determined. Consults called: Social work. Admission status: Inpatient.   Eduard Clos MD [facility] Pager [phone].  If 7PM-7AM, please contact night-coverage [email or web address] Password [facility]  03/08/2021, 2:24 AM

## 2021-03-08 NOTE — ED Notes (Signed)
Dr danford aware of pt increased hallucinations and increase in care level, he is talking to GI and going to change CIWA protocol and change Pt to stepdown.

## 2021-03-08 NOTE — Consult Note (Addendum)
NAME:  Corey Anthony, MRN:  [id number], DOB:  05-23-1986, LOS: 0 ADMISSION DATE:  03/07/2021, CONSULTATION DATE:  03/08/2021 REFERRING MD: Toniann Fail , CHIEF COMPLAINT:  ETOH Withdrawal   History of Present Illness:  Pt. Was unable to answer questions due to delirium and language barrier>> information from medical records  Corey Anthony is a 35 y.o. male never smoker with history of alcoholism and history of asthma presently not on any medication and on 03/07/2021 has been having increasing confusion with nausea vomiting and epigastric discomfort for the last 6 days.  Has been throwing up multiple times unable to keep anything.  He had come to the ED 03/04/2021, but left before he had been seen. Denies any blood in the vomitus.  Pain is mostly epigastric area nonradiating.  Denies any shortness of breath or chest pain.  In the Ed CT of the head was unremarkable.  EKG shows normal sinus rhythm with prolonged QTC and alcohol level was negative.  CBC unremarkable with sodium being 124 and potassium 2.9.  Patient's AST was 47 ALT 369 total bilirubin was 2.6 ammonia 108.  Serum osmolality was 268 and urine osmolality is 70 with urine sodium of less than 10.  Patient was given thiamine in the ER along with fluids.  Admitted for further work-up of acute encephalopathy with nausea vomiting and epigastric pain.  When I examined the patient patient is only oriented to his name and states that he is [place]. He was initially admitted by [facility], however he began to show signs of ETOH withdrawal 9/13. Per family they took his alcohol away from him 1 week ago. He was treated multiple times with ativan per CIWA and Haldol with no improvement in delirium.  PCCM were called to assess for Precedex infusion and ICU admission , as the patient had become violent.   Pertinent  Medical History   Past Medical History:  Diagnosis Date   Asthma      Significant Hospital Events: Including procedures, antibiotic start and stop dates in  addition to other pertinent events   03/07/2021 Admission 03/08/2021 Admission to ICU on precedex  Interim History / Subjective:  Remains combative, confused and hallucinating.   Objective   Blood pressure 103/64, pulse 84, temperature 98.5 F (36.9 C), temperature source Oral, resp. rate 16, weight 54.4 kg, SpO2 (!) 72 %.        Intake/Output Summary (Last 24 hours) at 03/08/2021 1306 Last data filed at 03/07/2021 2332 Gross per 24 hour  Intake 1000 ml  Output --  Net 1000 ml   Filed Weights   03/08/21 1240  Weight: 54.4 kg    Examination: General: agitated on stretcher, hallucinating confused, at risk of self harm, and harm to others HENT: NCAT, No LAD, No JVD Lungs: Bilateral chest excursion , clear throughout Cardiovascular: S1, S2, RRR, NO RMG, brisk capillary refill Abdomen:  Soft, Non-tender, BS diminished, ND, Body mass index is 21.24 kg/m. Extremities: No obvious deformities, MAE x 4,  Neuro: Awake and alert, MAE x 4, confused, does not follow commands, speaking in foreign language GU: Not assessed  Resolved Hospital Problem list     Assessment & Plan:  ETOH abuse with Withdrawal Delirium  Plan Admit to ICU Precedex gtt, titrate to RASS of 0 to -1 Monitor for seizures Fall risk  Continue CIWA protocol  Stress ulcer prophylaxis Will need counseling before discharge   At risk of compromised airway Plan Monitor for airway protection  Trend oxygen saturations , Goal >  92 %  Titrate oxygen as needed to maintain above sat  Acute Encephalopathy  Plan Trend ammonia level Lactulose as ordered, will change to enema as now NPO Frequent re-orientation  Lights on during the day and off at night. Thiamine 500 mg IV Q 8 Folic Acid IV 1 mg daily  Hyponatremia 2/2 vomiting/ diarrhea/ SAIDH Hypokalemia Plan Trend BMET  Hydrate  Elevated LFT's / Alcoholic hepatitis Trend LFT Replete electrolytes as needed  Elevated LFT's  Alcoholic Hepatitis>> acute panel  negative/ Negative Abdominal CT Tylenol level < 10 Plan Trend LFT's       Best Practice (right click and "Reselect all SmartList Selections" daily)   Diet/type: NPO DVT prophylaxis: SQ heparin GI prophylaxis: PPI Lines: N/A Foley:  N/A Code Status:  full code Last date of multidisciplinary goals of care discussion [No family at bedside]  Labs   CBC: Recent Labs  Lab 03/07/21 1131 03/08/21 0219  WBC 6.7 5.6  NEUTROABS 4.6 3.5  HGB 14.4 12.3*  HCT 40.3 35.0*  MCV 85.2 86.8  PLT 159 149*    Basic Metabolic Panel: Recent Labs  Lab 03/07/21 1131 03/08/21 0219 03/08/21 0755 03/08/21 0848  NA 124* 130* 132* 129*  K 2.9* 3.2* 3.4* 3.4*  CL 82* 95* 94* 93*  CO2 25 26 28 24   GLUCOSE 129* 148* 153* 210*  BUN 5* 5* 5* 5*  CREATININE 0.57* 0.61 0.60* 0.69  CALCIUM 9.7 8.8* 9.1 8.9   GFR: CrCl cannot be calculated (Unknown ideal weight.). Recent Labs  Lab 03/07/21 1131 03/08/21 0219  WBC 6.7 5.6    Liver Function Tests: Recent Labs  Lab 03/07/21 2035 03/08/21 0219  AST 487* 412*  ALT 369* 309*  ALKPHOS 119 99  BILITOT 2.6* 2.1*  PROT 8.2* 6.6  ALBUMIN 3.9 3.1*   Recent Labs  Lab 03/08/21 0219  LIPASE 93*   Recent Labs  Lab 03/07/21 2036  AMMONIA 108*    ABG No results found for: PHART, PCO2ART, PO2ART, HCO3, TCO2, ACIDBASEDEF, O2SAT   Coagulation Profile: Recent Labs  Lab 03/08/21 0219  INR 1.1    Cardiac Enzymes: No results for input(s): CKTOTAL, CKMB, CKMBINDEX, TROPONINI in the last 168 hours.  HbA1C: No results found for: HGBA1C  CBG: No results for input(s): GLUCAP in the last 168 hours.  Review of Systems:   Unable to assess due to confusion and combative behavior  Past Medical History:  He,  has a past medical history of Asthma.   Surgical History:   Past Surgical History:  Procedure Laterality Date   CLOSED REDUCTION METATARSAL Left 11/14/2018   Procedure: CLOSED REDUCTION METATARSAL 5TH WITH IRRIGATION AND  DEBRIDEMENT;  Surgeon: 11/16/2018, MD;  Location: [facility];  Service: Orthopedics;  Laterality: Left;  BLOCK IN PREOP   ORIF TOE FRACTURE Left 11/07/2018   Procedure: OPEN REDUCTION PERCUTANEOUS PINNING LEFT 3RD TOE PROXIMAL PHALANX, IRRIGATION AND DEBRIDEMENT LEFT 5TH TOE DISLOCATION;  Surgeon: 11/09/2018, MD;  Location: [facility];  Service: Orthopedics;  Laterality: Left;     Social History:   reports that he has never smoked. He has never used smokeless tobacco. He reports current alcohol use. He reports that he does not currently use drugs.   Family History:  His family history is negative for Diabetes Mellitus II.   Allergies Allergies  Allergen Reactions   Squid Oil Rash     Home Medications  Prior to Admission medications   Medication Sig Start Date  End Date Taking? Authorizing Provider  diphenhydrAMINE-PE-APAP 12.5-5-325 MG/15ML LIQD Take 15 mLs by mouth every 6 (six) hours as needed (flu symptoms, coughing).   Yes [provider]     Critical care time: 85 minutes    Bevelyn Ngo, MSN, AGACNP-BC [facility] Pulmonary/Critical Care Medicine See Amion for personal pager PCCM on call pager [phone]  03/08/2021 1:41 PM

## 2021-03-08 NOTE — ED Notes (Signed)
Pt ambulated with 1-assist.  Pt bears weight but was unsteady on his feet.

## 2021-03-08 NOTE — ED Notes (Signed)
Dr. Maryfrances Bunnell called back with update he is going to call the ICU team and they will assess the pt.

## 2021-03-08 NOTE — ED Notes (Addendum)
MD at bedside with interpreter and aware that pt is consistently pulling out his IV's and pulling off all his cardiac  leads and vital monitors.   He is also aware a safely sitter was.  Currently there are no sitters available according to the staffing office which this RN called to check on.

## 2021-03-09 ENCOUNTER — Other Ambulatory Visit: Payer: Self-pay

## 2021-03-09 ENCOUNTER — Inpatient Hospital Stay (HOSPITAL_COMMUNITY): Payer: 59

## 2021-03-09 DIAGNOSIS — R1013 Epigastric pain: Secondary | ICD-10-CM

## 2021-03-09 LAB — CBC
HCT: 38.8 % — ABNORMAL LOW (ref 39.0–52.0)
Hemoglobin: 13 g/dL (ref 13.0–17.0)
MCH: 29.7 pg (ref 26.0–34.0)
MCHC: 33.5 g/dL (ref 30.0–36.0)
MCV: 88.6 fL (ref 80.0–100.0)
Platelets: 170 10*3/uL (ref 150–400)
RBC: 4.38 MIL/uL (ref 4.22–5.81)
RDW: 13 % (ref 11.5–15.5)
WBC: 7.7 10*3/uL (ref 4.0–10.5)
nRBC: 0 % (ref 0.0–0.2)

## 2021-03-09 LAB — BASIC METABOLIC PANEL
Anion gap: 8 (ref 5–15)
BUN: <5 mg/dL — ABNORMAL LOW (ref 6–20)
CO2: 25 mmol/L (ref 22–32)
Calcium: 9.3 mg/dL (ref 8.9–10.3)
Chloride: 99 mmol/L (ref 98–111)
Creatinine, Ser: 0.49 mg/dL — ABNORMAL LOW (ref 0.61–1.24)
GFR, Estimated: >60 mL/min (ref >60)
Glucose, Bld: 81 mg/dL (ref 70–99)
Potassium: 3.8 mmol/L (ref 3.5–5.1)
Sodium: 132 mmol/L — ABNORMAL LOW (ref 135–145)

## 2021-03-09 LAB — RETICULOCYTES
Immature Retic Fract: 23.1 % — ABNORMAL HIGH (ref 2.3–15.9)
RBC.: 4.46 MIL/uL (ref 4.22–5.81)
Retic Count, Absolute: 98.6 10*3/uL (ref 19.0–186.0)
Retic Ct Pct: 2.2 % (ref 0.4–3.1)

## 2021-03-09 LAB — IRON AND TIBC
Iron: 139 ug/dL (ref 45–182)
Saturation Ratios: 36 % (ref 17.9–39.5)
TIBC: 384 ug/dL (ref 250–450)
UIBC: 245 ug/dL

## 2021-03-09 LAB — COMPREHENSIVE METABOLIC PANEL
ALT: 383 U/L — ABNORMAL HIGH (ref 0–44)
AST: 447 U/L — ABNORMAL HIGH (ref 15–41)
Albumin: 3.4 g/dL — ABNORMAL LOW (ref 3.5–5.0)
Alkaline Phosphatase: 101 U/L (ref 38–126)
Anion gap: 11 (ref 5–15)
BUN: <5 mg/dL — ABNORMAL LOW (ref 6–20)
CO2: 27 mmol/L (ref 22–32)
Calcium: 9 mg/dL (ref 8.9–10.3)
Chloride: 94 mmol/L — ABNORMAL LOW (ref 98–111)
Creatinine, Ser: 0.65 mg/dL (ref 0.61–1.24)
GFR, Estimated: >60 mL/min (ref >60)
Glucose, Bld: 88 mg/dL (ref 70–99)
Potassium: 3.6 mmol/L (ref 3.5–5.1)
Sodium: 132 mmol/L — ABNORMAL LOW (ref 135–145)
Total Bilirubin: 1.7 mg/dL — ABNORMAL HIGH (ref 0.3–1.2)
Total Protein: 7.1 g/dL (ref 6.5–8.1)

## 2021-03-09 LAB — HEPATITIS B SURFACE ANTIBODY,QUALITATIVE: Hep B S Ab: REACTIVE — AB

## 2021-03-09 LAB — MAGNESIUM: Magnesium: 1.5 mg/dL — ABNORMAL LOW (ref 1.7–2.4)

## 2021-03-09 LAB — PHOSPHORUS: Phosphorus: 2.8 mg/dL (ref 2.5–4.6)

## 2021-03-09 LAB — FERRITIN: Ferritin: 1370 ng/mL — ABNORMAL HIGH (ref 24–336)

## 2021-03-09 LAB — FOLATE: Folate: 23.5 ng/mL (ref >5.9)

## 2021-03-09 LAB — VITAMIN B12: Vitamin B-12: 1279 pg/mL — ABNORMAL HIGH (ref 180–914)

## 2021-03-09 MED ORDER — THIAMINE HCL 100 MG/ML IJ SOLN
100.0000 mg | Freq: Every day | INTRAMUSCULAR | Status: DC
  Administered 2021-03-13: 100 mg via INTRAVENOUS
  Filled 2021-03-09: qty 2

## 2021-03-09 MED ORDER — DEXTROSE IN LACTATED RINGERS 5 % IV SOLN: INTRAVENOUS | Status: DC

## 2021-03-09 MED ORDER — FOLIC ACID 1 MG PO TABS
1.0000 mg | ORAL_TABLET | Freq: Every day | ORAL | Status: DC
  Administered 2021-03-10 – 2021-03-13 (×4): 1 mg via ORAL
  Filled 2021-03-09 (×5): qty 1

## 2021-03-09 MED ORDER — PANTOPRAZOLE SODIUM 40 MG PO TBEC: 40.0000 mg | DELAYED_RELEASE_TABLET | Freq: Every day | ORAL | Status: DC

## 2021-03-09 MED ORDER — MAGNESIUM SULFATE 4 GM/100ML IV SOLN
4.0000 g | Freq: Once | INTRAVENOUS | Status: AC
  Administered 2021-03-09: 4 g via INTRAVENOUS
  Filled 2021-03-09: qty 100

## 2021-03-09 MED ORDER — LACTULOSE 10 GM/15ML PO SOLN
20.0000 g | Freq: Two times a day (BID) | ORAL | Status: DC
  Administered 2021-03-09 – 2021-03-10 (×2): 20 g via ORAL
  Filled 2021-03-09 (×2): qty 30

## 2021-03-09 MED ORDER — THIAMINE HCL 100 MG/ML IJ SOLN
250.0000 mg | Freq: Every day | INTRAVENOUS | Status: AC
  Administered 2021-03-10 – 2021-03-12 (×3): 250 mg via INTRAVENOUS
  Filled 2021-03-09 (×4): qty 2.5

## 2021-03-09 MED ORDER — LACTULOSE ENEMA: 300.0000 mL | Freq: Every day | ORAL | Status: DC

## 2021-03-09 MED ORDER — FOLIC ACID 5 MG/ML IJ SOLN
1.0000 mg | Freq: Every day | INTRAMUSCULAR | Status: DC
  Filled 2021-03-09 (×4): qty 0.2

## 2021-03-09 MED ORDER — PANTOPRAZOLE SODIUM 40 MG PO TBEC
40.0000 mg | DELAYED_RELEASE_TABLET | Freq: Every day | ORAL | Status: DC
  Administered 2021-03-10 – 2021-03-13 (×3): 40 mg via ORAL
  Filled 2021-03-09 (×5): qty 1

## 2021-03-09 NOTE — Progress Notes (Signed)
[facility] ADULT ICU REPLACEMENT PROTOCOL   The patient does apply for the [facility] Adult ICU Electrolyte Replacment Protocol based on the criteria listed below:   1.Exclusion criteria: TCTS patients, ECMO patients and Hypothermia Protocol, and   Dialysis patients 2. Is GFR >/= 30 ml/min? Yes.    Patient's GFR today is >60 3. Is SCr </= 2? No. Patient's SCr is 0.49 mg/dL 4. Did SCr increase >/= 0.5 in 24 hours? No. 5.Pt's weight >40kg  Yes.   6. Abnormal electrolyte(s): mag 1.5  7. Electrolytes replaced per protocol 8.  Call MD STAT for K+ </= 2.5, Phos </= 1, or Mag </= 1 Physician:  n/a   Melvern Banker 03/09/2021 4:47 AM

## 2021-03-09 NOTE — Progress Notes (Signed)
Patient arrived in the unit at 2220 from 4N, alert, stable, steady gait, resting in a bed, initiated telemetry monitoring, and will continue to monitor closely.

## 2021-03-09 NOTE — Progress Notes (Signed)
NAME:  Corey Anthony, MRN:  [id number], DOB:  Nov 04, 1985, LOS: 1 ADMISSION DATE:  03/07/2021, CONSULTATION DATE:  03/08/2021 REFERRING MD: Toniann Fail , CHIEF COMPLAINT:  ETOH Withdrawal   History of Present Illness:  Pt. Was unable to answer questions due to delirium and language barrier>> information from medical records  Corey Anthony is a 35 y.o. male never smoker with history of alcoholism and history of asthma presently not on any medication and on 03/07/2021 has been having increasing confusion with nausea vomiting and epigastric discomfort for the last 6 days.  Has been throwing up multiple times unable to keep anything.  He had come to the ED 03/04/2021, but left before he had been seen. Denies any blood in the vomitus.  Pain is mostly epigastric area nonradiating.  Denies any shortness of breath or chest pain.  In the Ed CT of the head was unremarkable.  EKG shows normal sinus rhythm with prolonged QTC and alcohol level was negative.  CBC unremarkable with sodium being 124 and potassium 2.9.  Patient's AST was 47 ALT 369 total bilirubin was 2.6 ammonia 108.  Serum osmolality was 268 and urine osmolality is 70 with urine sodium of less than 10.  Patient was given thiamine in the ER along with fluids.  Admitted for further work-up of acute encephalopathy with nausea vomiting and epigastric pain.  When I examined the patient patient is only oriented to his name and states that he is [place]. He was initially admitted by [facility], however he began to show signs of ETOH withdrawal 9/13. Per family they took his alcohol away from him 1 week ago. He was treated multiple times with ativan per CIWA and Haldol with no improvement in delirium.  PCCM were called to assess for Precedex infusion and ICU admission , as the patient had become violent.   Pertinent  Medical History   Past Medical History:  Diagnosis Date   Alcohol abuse 02/2021   Asthma      Significant Hospital Events: Including procedures, antibiotic  start and stop dates in addition to other pertinent events   03/07/2021 Admission 03/08/2021 Admission to ICU on precedex  Interim History / Subjective:  Precedex weaned down this morning and hoping to get off. Pt somnolent.  Vitals stable.  Objective   Blood pressure (!) 146/103, pulse 69, temperature 98.1 F (36.7 C), temperature source Axillary, resp. rate 17, weight 54.4 kg, SpO2 100 %.        Intake/Output Summary (Last 24 hours) at 03/09/2021 0756 Last data filed at 03/09/2021 0600 Gross per 24 hour  Intake 2025.85 ml  Output 3200 ml  Net -1174.15 ml    Filed Weights   03/08/21 1240  Weight: 54.4 kg    Examination: General: Young adult male, resting in bed, in NAD. Neuro: Somnolent.  Opens eyes to voice and withdraws to pain. HEENT: [place]/AT. Sclerae anicteric. MM moist.. Cardiovascular: RRR, no M/R/G.  Lungs: Respirations even and unlabored.  CTA bilaterally, No W/R/R. Abdomen: BS x 4, soft, NT/ND.  Musculoskeletal: No gross deformities, no edema.  Skin: Intact, warm, no rashes.   Resolved Hospital Problem list     Assessment & Plan:   ETOH abuse with Withdrawal Delirium  Plan Continue weaning Precedex and hope to get off this AM Ativan PRN per CIWA Monitor for seizures Fall risk  Will need counseling before discharge   Acute Encephalopathy - 2/2 above + hepatic encephalopathy Plan Continue lactulose enemas until more awake then try PO Continue to trend  ammonia level Frequent re-orientation  Lights on during the day and off at night Thiamine 500 mg IV Q 8 Folic Acid IV 1 mg daily  Hyponatremia 2/2 vomiting/ diarrhea + chronic EtOH Hypomagnesemia - being repleted Plan Continue fluids Continue Mg repletion Trend BMET  Replete electrolytes as needed  Elevated LFT's  Alcoholic Hepatitis>> acute panel negative/ Negative Abdominal CT Tylenol level < 10 Plan Trend LFT's  GI following    Best Practice (right click and "Reselect all SmartList  Selections" daily)  Diet/type: NPO DVT prophylaxis: SQ heparin GI prophylaxis: PPI Lines: N/A Foley:  N/A Code Status:  full code Last date of multidisciplinary goals of care discussion [No family at bedside]  Critical care time: 30 min.    Rutherford Guys, PA - [facility] For pager details, please see AMION or use Epic chat  After 1900, please call [facility] for cross coverage needs 03/09/2021, 8:11 AM

## 2021-03-09 NOTE — Progress Notes (Addendum)
Daily Rounding Note  03/09/2021, 11:35 AM  LOS: 1 day   SUBJECTIVE:   Chief complaint:   ETOH hepatitis.  Acute alcohol withdrawal.  Precedex discontinued this morning.  Patient more alert and awake but still confused.  Passing multiple liquid, brown stools with lactulose enemas in place.  Asking to eat.    OBJECTIVE:         Vital signs in last 24 hours:    Temp:  [96.9 F (36.1 C)-98.2 F (36.8 C)] 98.2 F (36.8 C) (09/14 0700) Pulse Rate:  [48-86] 77 (09/14 1000) Resp:  [9-21] 21 (09/14 1000) BP: (103-178)/(64-133) 115/85 (09/14 1000) SpO2:  [72 %-100 %] 100 % (09/14 1000) Weight:  [54.4 kg] 54.4 kg (09/13 1240) Last BM Date:  (pta) Filed Weights   03/08/21 1240  Weight: 54.4 kg   General: jaundiced.  Looks better.  Alert.     Heart: RRR Chest: clear bil.  No dyspnea or cough Abdomen: soft, NT, thin, active BS, ND  Extremities: no CCE Neuro/Psych:  follows commands.  Slight tremor in hands but no asterixis/flap.    Intake/Output from previous day: 09/13 0701 - 09/14 0700 In: 4066.8 [I.V.:2058.8; IV Piggyback:1008] Out: 3200 [Urine:3200]  Intake/Output this shift: Total I/O In: 1287.5 [I.V.:254.8; Other:1000; IV Piggyback:32.7] Out: 1150 [Urine:400; Stool:750]  Lab Results: Recent Labs    03/07/21 1131 03/08/21 0219 03/09/21 0040  WBC 6.7 5.6 7.7  HGB 14.4 12.3* 13.0  HCT 40.3 35.0* 38.8*  PLT 159 149* 170   BMET Recent Labs    03/08/21 0848 03/09/21 0040 03/09/21 0902  NA 129* 132* 132*  K 3.4* 3.8 3.6  CL 93* 99 94*  CO2 '24 25 27  ' GLUCOSE 210* 81 88  BUN 5* <5* <5*  CREATININE 0.69 0.49* 0.65  CALCIUM 8.9 9.3 9.0   LFT Recent Labs    03/07/21 2035 03/08/21 0219 03/09/21 0902  PROT 8.2* 6.6 7.1  ALBUMIN 3.9 3.1* 3.4*  AST 487* 412* 447*  ALT 369* 309* 383*  ALKPHOS 119 99 101  BILITOT 2.6* 2.1* 1.7*  BILIDIR 1.1* 1.0*  --   IBILI 1.5* 1.1*  --    PT/INR Recent Labs     03/08/21 0219  LABPROT 13.8  INR 1.1   Hepatitis Panel Recent Labs    03/08/21 0219  HEPBSAG NON REACTIVE  HCVAB NON REACTIVE  HEPAIGM NON REACTIVE  HEPBIGM NON REACTIVE    Studies/Results: DG Ribs Unilateral W/Chest Right  Result Date: 03/07/2021 CLINICAL DATA:  Rib injury EXAM: RIGHT RIBS AND CHEST - 3+ VIEW COMPARISON:  None. FINDINGS: No pneumothorax or blunting of the costophrenic angles to suggest pleural effusion. Cardiac and mediastinal margins appear normal. The lungs appear clear. No pneumothorax. Marker noted projecting over the right lateral ribs in the vicinity of the fifth and sixth ribs. No discrete rib discontinuity is identified. IMPRESSION: 1. No well-defined rib injury is identified. The lungs appear clear. Electronically Signed   By: Van Clines M.D.   On: 03/07/2021 12:06   CT HEAD WO CONTRAST (5MM)  Result Date: 03/07/2021 CLINICAL DATA:  Mental status change. EXAM: CT HEAD WITHOUT CONTRAST TECHNIQUE: Contiguous axial images were obtained from the base of the skull through the vertex without intravenous contrast. COMPARISON:  CT head 04/05/2020. FINDINGS: Brain: No evidence of acute infarction, hemorrhage, hydrocephalus, extra-axial collection or mass lesion/mass effect. Vascular: No hyperdense vessel or unexpected calcification. Skull: Normal. Negative for fracture or focal lesion. Sinuses/Orbits:  No acute finding. Other: None. IMPRESSION: No acute intracranial abnormality. Electronically Signed   By: Ronney Asters M.D.   On: 03/07/2021 21:50   CT ABDOMEN PELVIS W CONTRAST  Result Date: 03/08/2021 CLINICAL DATA:  Abdominal pain EXAM: CT ABDOMEN AND PELVIS WITH CONTRAST TECHNIQUE: Multidetector CT imaging of the abdomen and pelvis was performed using the standard protocol following bolus administration of intravenous contrast. CONTRAST:  35m OMNIPAQUE IOHEXOL 350 MG/ML SOLN COMPARISON:  None. FINDINGS: Lower chest: Lung bases are clear. Hepatobiliary: Liver is  within normal limits. Gallbladder is unremarkable. No intrahepatic or extrahepatic ductal dilatation. Pancreas: Within normal limits. Spleen: Within normal limits. Adrenals/Urinary Tract: Adrenal glands are within normal limits. Kidneys are within normal limits.  No hydronephrosis. Bladder is within normal limits. Stomach/Bowel: Stomach is within normal limits. No evidence of bowel obstruction. Normal appendix (coronal image 46). No colonic wall thickening or inflammatory changes. Vascular/Lymphatic: No evidence of abdominal aortic aneurysm. No suspicious abdominopelvic lymphadenopathy. Reproductive: Prostate is unremarkable. Other: No abdominopelvic ascites. Musculoskeletal: Visualized osseous structures are within normal limits. IMPRESSION: Negative CT abdomen/pelvis. Electronically Signed   By: SJulian HyM.D.   On: 03/08/2021 03:40   DG Chest Port 1 View  Result Date: 03/09/2021 CLINICAL DATA:  Dyspnea EXAM: PORTABLE CHEST 1 VIEW COMPARISON:  Chest radiograph 03/08/2021 FINDINGS: The cardiomediastinal silhouette is stable. There is no focal consolidation or pulmonary edema. There is no pleural effusion or pneumothorax. There is no acute osseous abnormality. IMPRESSION: Stable chest with no radiographic evidence of acute cardiopulmonary process. Electronically Signed   By: PValetta MoleM.D.   On: 03/09/2021 08:09   DG CHEST PORT 1 VIEW  Result Date: 03/08/2021 CLINICAL DATA:  Shortness of breath EXAM: PORTABLE CHEST 1 VIEW COMPARISON:  03/07/2021 FINDINGS: The heart size and mediastinal contours are within normal limits. Both lungs are clear. The visualized skeletal structures are unremarkable. IMPRESSION: No active disease. Electronically Signed   By: KUlyses JarredM.D.   On: 03/08/2021 02:58    ASSESMENT:     ETOH hepatitis.  DF score 6.3.  By CT, no liver, biliary tree, gallbladder, pancreatic abnormalities.  T bili improved, transaminases rising.  Normal alk phos.  Acute hepatitis  serologies nonreactive.  Multiple blood tests pending to rule out metabolic and autoimmune sources of hepatitis but strong suspicion this is all related to alcohol.  So far ferritin level resulted and elevated at 1370and normal iron parameters.        AMS, agitation.  DTs consistent with acute alcohol withdrawal.  Ammonia level moderately elevated 108,   lactulose enemas in place.  Thiamine, folate, MVI in place.  Hyponatremia.  Stable at 132, improved from 129.   PLAN     Switch to po lactulose 20 gm bid.  Switch to oral PPI.  Continue supportive care.  Needs ETOH counseling and total ETOH abstinence.      SAzucena Freed 03/09/2021, 11:35 AM Phone [phone]

## 2021-03-10 LAB — ANTI-MICROSOMAL ANTIBODY LIVER / KIDNEY: LKM1 Ab: 1 Units (ref 0.0–20.0)

## 2021-03-10 LAB — CBC
HCT: 38.8 % — ABNORMAL LOW (ref 39.0–52.0)
Hemoglobin: 13.6 g/dL (ref 13.0–17.0)
MCH: 30.4 pg (ref 26.0–34.0)
MCHC: 35.1 g/dL (ref 30.0–36.0)
MCV: 86.8 fL (ref 80.0–100.0)
Platelets: 224 10*3/uL (ref 150–400)
RBC: 4.47 MIL/uL (ref 4.22–5.81)
RDW: 13.3 % (ref 11.5–15.5)
WBC: 9.1 10*3/uL (ref 4.0–10.5)
nRBC: 0 % (ref 0.0–0.2)

## 2021-03-10 LAB — ANTI-SMOOTH MUSCLE ANTIBODY, IGG: F-Actin IgG: 9 Units (ref 0–19)

## 2021-03-10 LAB — CERULOPLASMIN: Ceruloplasmin: 22.3 mg/dL (ref 16.0–31.0)

## 2021-03-10 LAB — PHOSPHORUS: Phosphorus: 2.6 mg/dL (ref 2.5–4.6)

## 2021-03-10 LAB — IGG: IgG (Immunoglobin G), Serum: 1371 mg/dL (ref 603–1613)

## 2021-03-10 LAB — ALPHA-1-ANTITRYPSIN: A-1 Antitrypsin, Ser: 149 mg/dL (ref 95–164)

## 2021-03-10 LAB — MITOCHONDRIAL ANTIBODIES: Mitochondrial M2 Ab, IgG: <20 Units (ref 0.0–20.0)

## 2021-03-10 LAB — MAGNESIUM: Magnesium: 2 mg/dL (ref 1.7–2.4)

## 2021-03-10 MED ORDER — LORAZEPAM 2 MG/ML IJ SOLN
2.0000 mg | Freq: Once | INTRAMUSCULAR | Status: AC
  Administered 2021-03-10: 2 mg via INTRAVENOUS

## 2021-03-10 NOTE — Progress Notes (Addendum)
        Daily Rounding Note  03/10/2021, 4:10 PM  LOS: 2 days   SUBJECTIVE:   Chief complaint:   ETOH hepatitis.  Acute alcohol withdrawal.  Able to walk to the bathroom without assistance. Had one stool today. Confusion is much improved. Patient's mother states that he occasionally is still not oriented to place.   OBJECTIVE:         Vital signs in last 24 hours:    Temp:  [97.6 F (36.4 C)-99.3 F (37.4 C)] 98.5 F (36.9 C) (09/15 1550) Pulse Rate:  [67-109] 70 (09/15 1550) Resp:  [16-24] 18 (09/15 1550) BP: (110-150)/(63-104) 121/89 (09/15 1550) SpO2:  [98 %-100 %] 100 % (09/15 1550) Weight:  [52.8 kg] 52.8 kg (09/15 0357) Last BM Date: 03/09/21 Filed Weights   03/08/21 1240 03/10/21 0357  Weight: 54.4 kg 52.8 kg   General: Alert. Appropriate. Heart: regular rate Chest: No increased WOB Abdomen: soft, NT Extremities: no CCE Neuro/Psych:  follows commands.  Slight tremor in hands but no asterixis/flap.    Intake/Output from previous day: 09/14 0701 - 09/15 0700 In: 2330.4 [P.O.:820; I.V.:254.8; IV Piggyback:255.6] Out: 1150 [Urine:400; Stool:750]  Intake/Output this shift: No intake/output data recorded.  Lab Results: Recent Labs    03/08/21 0219 03/09/21 0040 03/10/21 0326  WBC 5.6 7.7 9.1  HGB 12.3* 13.0 13.6  HCT 35.0* 38.8* 38.8*  PLT 149* 170 224   BMET Recent Labs    03/08/21 0848 03/09/21 0040 03/09/21 0902  NA 129* 132* 132*  K 3.4* 3.8 3.6  CL 93* 99 94*  CO2 24 25 27  GLUCOSE 210* 81 88  BUN 5* <5* <5*  CREATININE 0.69 0.49* 0.65  CALCIUM 8.9 9.3 9.0   LFT Recent Labs    03/07/21 2035 03/08/21 0219 03/09/21 0902  PROT 8.2* 6.6 7.1  ALBUMIN 3.9 3.1* 3.4*  AST 487* 412* 447*  ALT 369* 309* 383*  ALKPHOS 119 99 101  BILITOT 2.6* 2.1* 1.7*  BILIDIR 1.1* 1.0*  --   IBILI 1.5* 1.1*  --    PT/INR Recent Labs    03/08/21 0219  LABPROT 13.8  INR 1.1   Hepatitis  Panel Recent Labs    03/08/21 0219  HEPBSAG NON REACTIVE  HCVAB NON REACTIVE  HEPAIGM NON REACTIVE  HEPBIGM NON REACTIVE    Studies/Results: DG Chest Port 1 View  Result Date: 03/09/2021 CLINICAL DATA:  Dyspnea EXAM: PORTABLE CHEST 1 VIEW COMPARISON:  Chest radiograph 03/08/2021 FINDINGS: The cardiomediastinal silhouette is stable. There is no focal consolidation or pulmonary edema. There is no pleural effusion or pneumothorax. There is no acute osseous abnormality. IMPRESSION: Stable chest with no radiographic evidence of acute cardiopulmonary process. Electronically Signed   By: Peter  Noone M.D.   On: 03/09/2021 08:09    ASSESMENT:     ETOH hepatitis.  DF score 6.3, does not qualify for steroids.  By CT, no liver, biliary tree, gallbladder, pancreatic abnormalities.  T bili improved, transaminases rising.  Normal alk phos.  Acute hepatitis serologies nonreactive.  Iron/TIBC not concerning. Ceruloplasmin, A1AT, IgG nml. Multiple blood tests pending to rule out metabolic and autoimmune sources of hepatitis but strong suspicion this is all related to alcohol.      AMS, agitation.  DTs consistent with acute alcohol withdrawal.  Ammonia level moderately elevated 108  Thiamine, folate, MVI in place. On lactulose  Hyponatremia.  Stable at 132   PLAN   Continue PO lactulose. Titrate to aim for   2-3 Bms per day Encouraged ETOH abstinence. Case manager has provided some substance abuse education We have arranged for outpatient GI follow up in 1 month GI will sign off. Please call if any new questions arise  Claire , MD 

## 2021-03-10 NOTE — TOC Initial Note (Signed)
Transition of Care [facility]) - Initial/Assessment Note    Patient Details  Name: Corey Anthony MRN: [id number] Date of Birth: 1986/05/27  Transition [facility] CM/SW Contact:    Kermit Balo, RN Phone Number: 03/10/2021, 11:43 AM  Clinical Narrative:       Using Burmese interpreter services [phone]):           Patient states he lives with his parents. He uses [facility] Department for his healthcare. He denies issues with home meds. Pt states he will need transportation home as his parents don't drive.  TOC following.  Expected Discharge Plan: Home/Self Care Barriers to Discharge: Continued Medical Work up, Inadequate or no insurance   Patient Goals and CMS Choice        Expected Discharge Plan and Services Expected Discharge Plan: Home/Self Care   Discharge Planning Services: CM Consult   Living arrangements for the past 2 months: Apartment                                      Prior Living Arrangements/Services Living arrangements for the past 2 months: Apartment Lives with:: Parents Patient language and need for interpreter reviewed:: Yes Do you feel safe going back to the place where you live?: Yes               Activities of Daily Living Home Assistive Devices/Equipment: None ADL Screening (condition at time of admission) Patient's cognitive ability adequate to safely complete daily activities?: No Is the patient deaf or have difficulty hearing?: No Does the patient have difficulty seeing, even when wearing glasses/contacts?: No Does the patient have difficulty concentrating, remembering, or making decisions?: No Patient able to express need for assistance with ADLs?: No Does the patient have difficulty dressing or bathing?: No Independently performs ADLs?: Yes (appropriate for developmental age) Does the patient have difficulty walking or climbing stairs?: No Weakness of Legs: None Weakness of Arms/Hands: None  Permission Sought/Granted                   Emotional Assessment   Attitude/Demeanor/Rapport: Engaged Affect (typically observed): Accepting Orientation: : Oriented to Self, Oriented to Place, Oriented to  Time, Oriented to Situation   Psych Involvement: No (comment)  Admission diagnosis:  Alcohol withdrawal delirium (HCC) [F10.231] Hypokalemia [E87.6] Delirium tremens (HCC) [F10.231] Alcoholism (HCC) [F10.20] Hyponatremia [E87.1] SOB (shortness of breath) [R06.02] Acute encephalopathy [G93.40] Patient Active Problem List   Diagnosis Date Noted   Hyponatremia 03/08/2021   Acute encephalopathy 03/08/2021   Elevated LFTs 03/08/2021   Epigastric pain 03/08/2021   Alcoholism (HCC) 03/08/2021   Delirium tremens (HCC) 03/08/2021   Alcohol withdrawal delirium (HCC) 03/08/2021   Dislocation of fifth toe, left, open, initial encounter 11/14/2018   Closed fracture of third toe of left foot, initial encounter 11/14/2018   PCP:  Department, [facility]:   [id number] [place], [place] - [address] AT [facility] OF [address] & [facility] [address] [place] [address] Phone: [phone] Fax: [phone]     Social Determinants of Health (SDOH) Interventions    Readmission Risk Interventions No flowsheet data found.

## 2021-03-10 NOTE — Evaluation (Signed)
Physical Therapy Evaluation Patient Details Name: Corey Anthony MRN: [id number] DOB: [birth year] Today's Date: 03/10/2021  History of Present Illness  35 y.o. male [facility] to [facility] ED on 03/07/2021 with confusion, nausea and vomiting. Pt diagnosed with alcohol intoxication followed by severe DTs, admitted to ICU and started on precedex 9/13. PMH includes alcoholism and asthma.  Clinical Impression  Pt presents to PT with deficits in gait, balance, cognition. Pt reports gait instability, demonstrating gait deviations with dynamic gait challenges although experiencing no blatant losses of balance. Pt is unable to find room after ambulating despite being provided room number twice by PT, raising safety concerns. Pt will benefit from continued dynamic gait and balance training to aide in a return to independence and a physically demanding job.       Recommendations for follow up therapy are one component of a multi-disciplinary discharge planning process, led by the attending physician.  Recommendations may be updated based on patient status, additional functional criteria and insurance authorization.  Follow Up Recommendations No PT follow up;Supervision for mobility/OOB    Equipment Recommendations  None recommended by PT    Recommendations for Other Services       Precautions / Restrictions Precautions Precautions: Fall Restrictions Weight Bearing Restrictions: No      Mobility  Bed Mobility Overal bed mobility: Independent                  Transfers Overall transfer level: Independent                  Ambulation/Gait Ambulation/Gait assistance: Supervision Gait Distance (Feet): 600 Feet Assistive device: None Gait Pattern/deviations: Step-through pattern Gait velocity: functional Gait velocity interpretation: >2.62 ft/sec, indicative of community ambulatory General Gait Details: pt is able to change step/stride length, turn quickly without loss of balance. Pt does  report feelings of instability and demonstrates increased lateral drift when ambulating. PT attempts to challenge pt with head turns when ambulating however pt is unable to follow this command  Stairs            Wheelchair Mobility    Modified Rankin (Stroke Patients Only)       Balance Overall balance assessment: Needs assistance Sitting-balance support: No upper extremity supported;Feet supported Sitting balance-Leahy Scale: Good     Standing balance support: No upper extremity supported;During functional activity Standing balance-Leahy Scale: Good                               Pertinent Vitals/Pain Pain Assessment: No/denies pain    Home Living Family/patient expects to be discharged to:: Private residence Living Arrangements: Parent;Other relatives (sister) Available Help at Discharge: Family;Available 24 hours/day Type of Home: House Home Access: Stairs to enter Entrance Stairs-Rails: Right Entrance Stairs-Number of Steps: 2 Home Layout: Other (Comment);Multi-level (split level) Home Equipment: None      Prior Function Level of Independence: Independent         Comments: works in a [employer]        Extremity/Trunk Assessment   Upper Extremity Assessment Upper Extremity Assessment: Overall WFL for tasks assessed    Lower Extremity Assessment Lower Extremity Assessment: Overall WFL for tasks assessed    Cervical / Trunk Assessment Cervical / Trunk Assessment: Normal  Communication   Communication: Prefers language other than English (burmese video interpreter utilized)  Cognition Arousal/Alertness: Awake/alert Behavior During Therapy: WFL for tasks assessed/performed Overall Cognitive Status: Impaired/Different  from baseline Area of Impairment: Orientation;Attention;Memory;Following commands;Safety/judgement;Awareness;Problem solving                 Orientation Level: Disoriented to;Time;Situation  (pt is uncertain of city but does know he is in a hospital in [place]) Current Attention Level: Sustained Memory: Decreased recall of precautions;Decreased short-term memory Following Commands: Follows one step commands consistently;Follows multi-step commands inconsistently Safety/Judgement: Decreased awareness of safety;Decreased awareness of deficits Awareness: Emergent Problem Solving: Difficulty sequencing        General Comments General comments (skin integrity, edema, etc.): VSS on RA    Exercises     Assessment/Plan    PT Assessment Patient needs continued PT services  PT Problem List Decreased balance;Decreased mobility;Decreased cognition;Decreased safety awareness       PT Treatment Interventions DME instruction;Gait training;Stair training;Functional mobility training;Therapeutic activities;Therapeutic exercise;Balance training;Neuromuscular re-education;Cognitive remediation;Patient/family education    PT Goals (Current goals can be found in the Care Plan section)  Acute Rehab PT Goals Patient Stated Goal: to return to work PT Goal Formulation: With patient Time For Goal Achievement: 03/24/21 Potential to Achieve Goals: Good Additional Goals Additional Goal #1: Pt will score >19/24 on DGI to indicate a reduced risk for falls Additional Goal #2: Pt will successfully complete a 3+ step path finding task to demonstrate improved memory and to improve safety in navigating the community    Frequency Min 3X/week   Barriers to discharge        Co-evaluation               AM-PAC PT "6 Clicks" Mobility  Outcome Measure Help needed turning from your back to your side while in a flat bed without using bedrails?: None Help needed moving from lying on your back to sitting on the side of a flat bed without using bedrails?: None Help needed moving to and from a bed to a chair (including a wheelchair)?: None Help needed standing up from a chair using your arms (e.g.,  wheelchair or bedside chair)?: None Help needed to walk in hospital room?: A Little Help needed climbing 3-5 steps with a railing? : A Little 6 Click Score: 22    End of Session   Activity Tolerance: Patient tolerated treatment well Patient left: in bed;with call bell/phone within reach;with family/visitor present Nurse Communication: Mobility status PT Visit Diagnosis: Other abnormalities of gait and mobility (R26.89)    Time: 4431-5400 PT Time Calculation (min) (ACUTE ONLY): 33 min   Charges:   PT Evaluation $PT Eval Low Complexity: 1 Low          Arlyss Gandy, PT, DPT Acute Rehabilitation Pager: [phone]   Arlyss Gandy 03/10/2021, 5:41 PM

## 2021-03-10 NOTE — Progress Notes (Signed)
PROGRESS NOTE                                                                                                                                                                                                             Patient Demographics:    Corey Anthony, is a 35 y.o. male, DOB - Oct 28, 1985, SHF:[id number]  Outpatient Primary MD for the patient is Department, [facility]    LOS - 2  Admit date - 03/07/2021    Chief Complaint  Patient presents with   Chest Pain   Alcohol Problem       Brief Narrative (HPI from H&P)  Corey Anthony is a 35 y.o. male never smoker with history of alcoholism and history of asthma presently not on any medication and on 03/07/2021 has been having increasing confusion with nausea vomiting and epigastric discomfort for several days, he was diagnosed with initially alcohol intoxication followed by severe DTs, he was admitted to the ICU on Precedex drip, he was stabilized and transferred to hospitalist service on 03/10/2021.  GI has also seen the patient this admission.   Subjective:    [name] today has, No headache, No chest pain, No abdominal pain - No Nausea, No new weakness tingling or numbness, no SOB   Assessment  & Plan :   Alcohol abuse and DTs.  With mild alcoholic hepatitis.  Initially treated in ICU on Precedex drip, currently on CIWA protocol with much improvement, has been extensively counseled to quit alcohol, continue monitoring closely, advance activity, if stable discharge discharge in the next 1 to 2 days.  2.  Hyponatremia and hypomagnesemia.  Replaced.  3.  Alcoholic hepatitis.  Mild.  Stable synthetic function, INR stable, GI on board.  Monitor.  Trend is improving.      Condition - Fair  Family Communication  :  None present  Code Status :  Full  Consults  :  GI, PCCM  PUD Prophylaxis : PPI   Procedures  :      CT Head & CT Abd - Pelvis - Non acute       Disposition Plan  :    Status is: Inpatient  Remains inpatient appropriate because:IV treatments appropriate due to intensity of illness or inability to take PO  Dispo: The patient is from: Home  Anticipated d/c is to: Home              Patient currently is not medically stable to d/c.   Difficult to place patient No  DVT Prophylaxis  :    heparin injection 5,000 Units Start: 03/08/21 1400 SCDs Start: 03/08/21 1254  Lab Results  Component Value Date   PLT 224 03/10/2021    Diet :  Diet Order             Diet regular Room service appropriate? Yes with Assist; Fluid consistency: Thin  Diet effective now                    Inpatient Medications  Scheduled Meds:  Chlorhexidine Gluconate Cloth  6 each Topical Daily   folic acid  1 mg Intravenous Daily   Or   folic acid  1 mg Oral Daily   heparin injection (subcutaneous)  5,000 Units Subcutaneous Q8H   influenza vac split quadrivalent PF  0.5 mL Intramuscular Tomorrow-1000   lactulose  20 g Oral BID   multivitamin with minerals  1 tablet Oral Daily   pantoprazole  40 mg Oral Q0600   pneumococcal 23 valent vaccine  0.5 mL Intramuscular Tomorrow-1000   [START ON 03/13/2021] thiamine injection  100 mg Intravenous Daily   Continuous Infusions:  thiamine injection     PRN Meds:.docusate sodium, LORazepam **OR** LORazepam, ondansetron (ZOFRAN) IV, polyethylene glycol  Antibiotics  :    Anti-infectives (From admission, onward)    None        Time Spent in minutes  30   Susa Raring M.D on 03/10/2021 at 11:08 AM  To page go to [email or web address]   [facility] -  Office  [phone]    See all Orders from today for further details    Objective:   Vitals:   03/09/21 2340 03/10/21 0355 03/10/21 0357 03/10/21 0700  BP: 133/87 (!) 134/94  (!) 142/101  Pulse: 79 67  72  Resp: 16 17  18   Temp: 98.1 F (36.7 C) 99.1 F (37.3 C)  98.1 F (36.7 C)  TempSrc: Oral Oral  Oral  SpO2: 100%  98%  100%  Weight:   52.8 kg     Wt Readings from Last 3 Encounters:  03/10/21 52.8 kg  11/14/18 54.4 kg  11/07/18 53.5 kg     Intake/Output Summary (Last 24 hours) at 03/10/2021 1108 Last data filed at 03/10/2021 0534 Gross per 24 hour  Intake 1042.86 ml  Output --  Net 1042.86 ml     Physical Exam  Awake Alert, No new F.N deficits, Normal affect [place].AT,PERRAL Supple Neck,No JVD, No cervical lymphadenopathy appriciated.  Symmetrical Chest wall movement, Good air movement bilaterally, CTAB RRR,No Gallops,Rubs or new Murmurs, No Parasternal Heave +ve B.Sounds, Abd Soft, No tenderness, No organomegaly appriciated, No rebound - guarding or rigidity. No Cyanosis, Clubbing or edema, No new Rash or bruise      Data Review:    CBC Recent Labs  Lab 03/07/21 1131 03/08/21 0219 03/09/21 0040 03/10/21 0326  WBC 6.7 5.6 7.7 9.1  HGB 14.4 12.3* 13.0 13.6  HCT 40.3 35.0* 38.8* 38.8*  PLT 159 149* 170 224  MCV 85.2 86.8 88.6 86.8  MCH 30.4 30.5 29.7 30.4  MCHC 35.7 35.1 33.5 35.1  RDW 12.5 12.8 13.0 13.3  LYMPHSABS 0.7 0.9  --   --   MONOABS 1.2* 1.2*  --   --   EOSABS 0.0 0.0  --   --  BASOSABS 0.1 0.0  --   --     Recent Labs  Lab 03/07/21 2035 03/07/21 2036 03/08/21 0219 03/08/21 0755 03/08/21 0848 03/09/21 0040 03/09/21 0902 03/10/21 0326  NA  --   --  130* 132* 129* 132* 132*  --   K  --   --  3.2* 3.4* 3.4* 3.8 3.6  --   CL  --   --  95* 94* 93* 99 94*  --   CO2  --   --  26 28 24 25 27   --   GLUCOSE  --   --  148* 153* 210* 81 88  --   BUN  --   --  5* 5* 5* <5* <5*  --   CREATININE  --   --  0.61 0.60* 0.69 0.49* 0.65  --   CALCIUM  --   --  8.8* 9.1 8.9 9.3 9.0  --   AST 487*  --  412*  --   --   --  447*  --   ALT 369*  --  309*  --   --   --  383*  --   ALKPHOS 119  --  99  --   --   --  101  --   BILITOT 2.6*  --  2.1*  --   --   --  1.7*  --   ALBUMIN 3.9  --  3.1*  --   --   --  3.4*  --   MG  --   --   --   --  1.6* 1.5*  --  2.0  INR  --    --  1.1  --   --   --   --   --   AMMONIA  --  108*  --   --   --   --   --   --     ------------------------------------------------------------------------------------------------------------------ No results for input(s): CHOL, HDL, LDLCALC, TRIG, CHOLHDL, LDLDIRECT in the last 72 hours.  No results found for: HGBA1C ------------------------------------------------------------------------------------------------------------------ No results for input(s): TSH, T4TOTAL, T3FREE, THYROIDAB in the last 72 hours.  Invalid input(s): FREET3  Cardiac Enzymes No results for input(s): CKMB, TROPONINI, MYOGLOBIN in the last 168 hours.  Invalid input(s): CK ------------------------------------------------------------------------------------------------------------------ No results found for: BNP   Radiology Reports DG Ribs Unilateral W/Chest Right  Result Date: 03/07/2021 CLINICAL DATA:  Rib injury EXAM: RIGHT RIBS AND CHEST - 3+ VIEW COMPARISON:  None. FINDINGS: No pneumothorax or blunting of the costophrenic angles to suggest pleural effusion. Cardiac and mediastinal margins appear normal. The lungs appear clear. No pneumothorax. Marker noted projecting over the right lateral ribs in the vicinity of the fifth and sixth ribs. No discrete rib discontinuity is identified. IMPRESSION: 1. No well-defined rib injury is identified. The lungs appear clear. Electronically Signed   By: 05/07/2021 M.D.   On: 03/07/2021 12:06   CT HEAD WO CONTRAST (05/07/2021)  Result Date: 03/07/2021 CLINICAL DATA:  Mental status change. EXAM: CT HEAD WITHOUT CONTRAST TECHNIQUE: Contiguous axial images were obtained from the base of the skull through the vertex without intravenous contrast. COMPARISON:  CT head 04/05/2020. FINDINGS: Brain: No evidence of acute infarction, hemorrhage, hydrocephalus, extra-axial collection or mass lesion/mass effect. Vascular: No hyperdense vessel or unexpected calcification. Skull: Normal.  Negative for fracture or focal lesion. Sinuses/Orbits: No acute finding. Other: None. IMPRESSION: No acute intracranial abnormality. Electronically Signed   By: 06/05/2020 M.D.   On: 03/07/2021  21:50   CT ABDOMEN PELVIS W CONTRAST  Result Date: 03/08/2021 CLINICAL DATA:  Abdominal pain EXAM: CT ABDOMEN AND PELVIS WITH CONTRAST TECHNIQUE: Multidetector CT imaging of the abdomen and pelvis was performed using the standard protocol following bolus administration of intravenous contrast. CONTRAST:  38mL OMNIPAQUE IOHEXOL 350 MG/ML SOLN COMPARISON:  None. FINDINGS: Lower chest: Lung bases are clear. Hepatobiliary: Liver is within normal limits. Gallbladder is unremarkable. No intrahepatic or extrahepatic ductal dilatation. Pancreas: Within normal limits. Spleen: Within normal limits. Adrenals/Urinary Tract: Adrenal glands are within normal limits. Kidneys are within normal limits.  No hydronephrosis. Bladder is within normal limits. Stomach/Bowel: Stomach is within normal limits. No evidence of bowel obstruction. Normal appendix (coronal image 46). No colonic wall thickening or inflammatory changes. Vascular/Lymphatic: No evidence of abdominal aortic aneurysm. No suspicious abdominopelvic lymphadenopathy. Reproductive: Prostate is unremarkable. Other: No abdominopelvic ascites. Musculoskeletal: Visualized osseous structures are within normal limits. IMPRESSION: Negative CT abdomen/pelvis. Electronically Signed   By: Charline Bills M.D.   On: 03/08/2021 03:40   DG Chest Port 1 View  Result Date: 03/09/2021 CLINICAL DATA:  Dyspnea EXAM: PORTABLE CHEST 1 VIEW COMPARISON:  Chest radiograph 03/08/2021 FINDINGS: The cardiomediastinal silhouette is stable. There is no focal consolidation or pulmonary edema. There is no pleural effusion or pneumothorax. There is no acute osseous abnormality. IMPRESSION: Stable chest with no radiographic evidence of acute cardiopulmonary process. Electronically Signed   By: Lesia Hausen M.D.   On: 03/09/2021 08:09   DG CHEST PORT 1 VIEW  Result Date: 03/08/2021 CLINICAL DATA:  Shortness of breath EXAM: PORTABLE CHEST 1 VIEW COMPARISON:  03/07/2021 FINDINGS: The heart size and mediastinal contours are within normal limits. Both lungs are clear. The visualized skeletal structures are unremarkable. IMPRESSION: No active disease. Electronically Signed   By: Deatra Robinson M.D.   On: 03/08/2021 02:58

## 2021-03-10 NOTE — Progress Notes (Signed)
Hospital follow up made with Dr. Leonides Schanz on 04/13/21 at 1:30 pm.

## 2021-03-10 NOTE — TOC CAGE-AID Note (Signed)
Transition of Care [facility]) - CAGE-AID Screening   Patient Details  Name: Corey Anthony MRN: [id number] Date of Birth: [birth year]  [facility]) CM/SW Contact:    Kermit Balo, RN Phone Number: 03/10/2021, 11:41 AM   Clinical Narrative: Using [place]mese interpreter: Pt states he mostly drinks before meals to make himself hungry. He did state he is going to admit to an inpatient facility once he gets time off approved at his job. Pt has the list of inpatient alcohol rehabs.    CAGE-AID Screening:    Have You Ever Felt You Ought to Cut Down on Your Drinking or Drug Use?: Yes Have People Annoyed You By Critizing Your Drinking Or Drug Use?: No Have You Felt Bad Or Guilty About Your Drinking Or Drug Use?: Yes Have You Ever Had a Drink or Used Drugs First Thing In The Morning to Steady Your Nerves or to Get Rid of a Hangover?: No CAGE-AID Score: 2  Substance Abuse Education Offered: Yes (pt provided resources on inpatient/ outpatient alcohol counseling)  Substance abuse interventions: Patient Counseling

## 2021-03-11 LAB — CBC WITH DIFFERENTIAL/PLATELET
Abs Immature Granulocytes: 0.05 10*3/uL (ref 0.00–0.07)
Basophils Absolute: 0.1 10*3/uL (ref 0.0–0.1)
Basophils Relative: 1 %
Eosinophils Absolute: 0.1 10*3/uL (ref 0.0–0.5)
Eosinophils Relative: 2 %
HCT: 39.1 % (ref 39.0–52.0)
Hemoglobin: 13.4 g/dL (ref 13.0–17.0)
Immature Granulocytes: 1 %
Lymphocytes Relative: 28 %
Lymphs Abs: 1.9 10*3/uL (ref 0.7–4.0)
MCH: 30.2 pg (ref 26.0–34.0)
MCHC: 34.3 g/dL (ref 30.0–36.0)
MCV: 88.1 fL (ref 80.0–100.0)
Monocytes Absolute: 1.5 10*3/uL — ABNORMAL HIGH (ref 0.1–1.0)
Monocytes Relative: 23 %
Neutro Abs: 3.1 10*3/uL (ref 1.7–7.7)
Neutrophils Relative %: 45 %
Platelets: 309 10*3/uL (ref 150–400)
RBC: 4.44 MIL/uL (ref 4.22–5.81)
RDW: 13.4 % (ref 11.5–15.5)
WBC: 6.7 10*3/uL (ref 4.0–10.5)
nRBC: 0 % (ref 0.0–0.2)

## 2021-03-11 LAB — COMPREHENSIVE METABOLIC PANEL
ALT: 242 U/L — ABNORMAL HIGH (ref 0–44)
AST: 186 U/L — ABNORMAL HIGH (ref 15–41)
Albumin: 3.4 g/dL — ABNORMAL LOW (ref 3.5–5.0)
Alkaline Phosphatase: 108 U/L (ref 38–126)
Anion gap: 12 (ref 5–15)
BUN: 5 mg/dL — ABNORMAL LOW (ref 6–20)
CO2: 25 mmol/L (ref 22–32)
Calcium: 10 mg/dL (ref 8.9–10.3)
Chloride: 97 mmol/L — ABNORMAL LOW (ref 98–111)
Creatinine, Ser: 0.57 mg/dL — ABNORMAL LOW (ref 0.61–1.24)
GFR, Estimated: >60 mL/min (ref >60)
Glucose, Bld: 80 mg/dL (ref 70–99)
Potassium: 3.6 mmol/L (ref 3.5–5.1)
Sodium: 134 mmol/L — ABNORMAL LOW (ref 135–145)
Total Bilirubin: 1.3 mg/dL — ABNORMAL HIGH (ref 0.3–1.2)
Total Protein: 7.8 g/dL (ref 6.5–8.1)

## 2021-03-11 LAB — MAGNESIUM: Magnesium: 2 mg/dL (ref 1.7–2.4)

## 2021-03-11 MED ORDER — POTASSIUM CHLORIDE 2 MEQ/ML IV SOLN
INTRAVENOUS | Status: DC
  Filled 2021-03-11 (×3): qty 1000

## 2021-03-11 MED ORDER — LORAZEPAM 2 MG/ML IJ SOLN
1.0000 mg | INTRAMUSCULAR | Status: DC | PRN
  Administered 2021-03-11: 2 mg via INTRAVENOUS
  Filled 2021-03-11: qty 1

## 2021-03-11 MED ORDER — LORAZEPAM 1 MG PO TABS
1.0000 mg | ORAL_TABLET | ORAL | Status: DC | PRN
  Administered 2021-03-11: 1 mg via ORAL
  Filled 2021-03-11: qty 1

## 2021-03-11 MED ORDER — CHLORDIAZEPOXIDE HCL 5 MG PO CAPS
15.0000 mg | ORAL_CAPSULE | Freq: Three times a day (TID) | ORAL | Status: DC
  Administered 2021-03-11 – 2021-03-13 (×7): 15 mg via ORAL
  Filled 2021-03-11 (×7): qty 3

## 2021-03-11 NOTE — Progress Notes (Signed)
PROGRESS NOTE                                                                                                                                                                                                             Patient Demographics:    Corey Anthony, is a 35 y.o. male, DOB - 1985-10-21, XBJ:[id number]  Outpatient Primary MD for the patient is Department, [facility]    LOS - 3  Admit date - 03/07/2021    Chief Complaint  Patient presents with   Chest Pain   Alcohol Problem       Brief Narrative (HPI from H&P)  Corey Anthony is a 35 y.o. male never smoker with history of alcoholism and history of asthma presently not on any medication and on 03/07/2021 has been having increasing confusion with nausea vomiting and epigastric discomfort for several days, he was diagnosed with initially alcohol intoxication followed by severe DTs, he was admitted to the ICU on Precedex drip, he was stabilized and transferred to hospitalist service on 03/10/2021.  GI has also seen the patient this admission.   Subjective:   In bed, denies headache, no chest pain or SOB, looks confused   Assessment  & Plan :   Alcohol abuse and DTs.  With mild alcoholic hepatitis.  Initially treated in ICU on Precedex drip, currently on CIWA protocol now went into severe DTs again night of 03/10/21 - Librium + CIWA, IVF - restraints PRN for severe agitation..  2.  Hyponatremia and hypomagnesemia.  IVF, K and Mag Replaced.  3.  Alcoholic hepatitis.  Mild.  Stable synthetic function, INR stable, GI on board.  Monitor.  Trend is improving.      Condition - Fair  Family Communication  :  None present  Code Status :  Full  Consults  :  GI, PCCM  PUD Prophylaxis : PPI   Procedures  :      CT Head & CT Abd - Pelvis - Non acute      Disposition Plan  :    Status is: Inpatient  Remains inpatient appropriate because:IV treatments appropriate due to  intensity of illness or inability to take PO  Dispo: The patient is from: Home              Anticipated d/c is to: Home  Patient currently is not medically stable to d/c.   Difficult to place patient No  DVT Prophylaxis  :    heparin injection 5,000 Units Start: 03/08/21 1400 SCDs Start: 03/08/21 1254  Lab Results  Component Value Date   PLT 309 03/11/2021    Diet :  Diet Order             DIET SOFT Room service appropriate? Yes; Fluid consistency: Nectar Thick  Diet effective now                    Inpatient Medications  Scheduled Meds:  chlordiazePOXIDE  15 mg Oral TID   Chlorhexidine Gluconate Cloth  6 each Topical Daily   folic acid  1 mg Intravenous Daily   Or   folic acid  1 mg Oral Daily   heparin injection (subcutaneous)  5,000 Units Subcutaneous Q8H   influenza vac split quadrivalent PF  0.5 mL Intramuscular Tomorrow-1000   multivitamin with minerals  1 tablet Oral Daily   pantoprazole  40 mg Oral Q0600   pneumococcal 23 valent vaccine  0.5 mL Intramuscular Tomorrow-1000   [START ON 03/13/2021] thiamine injection  100 mg Intravenous Daily   Continuous Infusions:  dextrose 5 % lactated ringers with kcl     thiamine injection 250 mg (03/10/21 1352)   PRN Meds:.docusate sodium, LORazepam **OR** LORazepam, ondansetron (ZOFRAN) IV, polyethylene glycol  Antibiotics  :    Anti-infectives (From admission, onward)    None        Time Spent in minutes  30   Susa Raring M.D on 03/11/2021 at 8:50 AM  To page go to [email or web address]   [facility] -  Office  [phone]    See all Orders from today for further details    Objective:   Vitals:   03/10/21 1958 03/10/21 2346 03/11/21 0424 03/11/21 0450  BP: 135/82 123/87 (!) 124/91   Pulse: 68 71 80   Resp: 17 16 18    Temp: 97.8 F (36.6 C)  98.3 F (36.8 C)   TempSrc: Oral  Oral   SpO2: 100% 100% 100%   Weight:    54.5 kg    Wt Readings from Last 3 Encounters:  03/11/21  54.5 kg  11/14/18 54.4 kg  11/07/18 53.5 kg     Intake/Output Summary (Last 24 hours) at 03/11/2021 0850 Last data filed at 03/10/2021 1800 Gross per 24 hour  Intake 50 ml  Output --  Net 50 ml     Physical Exam  Awake but confused, No new F.N deficits, arms in restraints placed 03/10/21 night [place].AT,PERRAL Supple Neck,No JVD, No cervical lymphadenopathy appriciated.  Symmetrical Chest wall movement, Good air movement bilaterally, CTAB RRR,No Gallops, Rubs or new Murmurs, No Parasternal Heave +ve B.Sounds, Abd Soft, No tenderness, No organomegaly appriciated, No rebound - guarding or rigidity. No Cyanosis, Clubbing or edema, No new Rash or bruise      Data Review:    CBC Recent Labs  Lab 03/07/21 1131 03/08/21 0219 03/09/21 0040 03/10/21 0326 03/11/21 0312  WBC 6.7 5.6 7.7 9.1 6.7  HGB 14.4 12.3* 13.0 13.6 13.4  HCT 40.3 35.0* 38.8* 38.8* 39.1  PLT 159 149* 170 224 309  MCV 85.2 86.8 88.6 86.8 88.1  MCH 30.4 30.5 29.7 30.4 30.2  MCHC 35.7 35.1 33.5 35.1 34.3  RDW 12.5 12.8 13.0 13.3 13.4  LYMPHSABS 0.7 0.9  --   --  1.9  MONOABS 1.2* 1.2*  --   --  1.5*  EOSABS 0.0 0.0  --   --  0.1  BASOSABS 0.1 0.0  --   --  0.1    Recent Labs  Lab 03/07/21 2035 03/07/21 2036 03/08/21 0219 03/08/21 0755 03/08/21 0848 03/09/21 0040 03/09/21 0902 03/10/21 0326 03/11/21 0312  NA  --   --  130* 132* 129* 132* 132*  --  134*  K  --   --  3.2* 3.4* 3.4* 3.8 3.6  --  3.6  CL  --   --  95* 94* 93* 99 94*  --  97*  CO2  --   --  26 28 24 25 27   --  25  GLUCOSE  --   --  148* 153* 210* 81 88  --  80  BUN  --   --  5* 5* 5* <5* <5*  --  5*  CREATININE  --   --  0.61 0.60* 0.69 0.49* 0.65  --  0.57*  CALCIUM  --   --  8.8* 9.1 8.9 9.3 9.0  --  10.0  AST 487*  --  412*  --   --   --  447*  --  186*  ALT 369*  --  309*  --   --   --  383*  --  242*  ALKPHOS 119  --  99  --   --   --  101  --  108  BILITOT 2.6*  --  2.1*  --   --   --  1.7*  --  1.3*  ALBUMIN 3.9  --  3.1*  --    --   --  3.4*  --  3.4*  MG  --   --   --   --  1.6* 1.5*  --  2.0 2.0  INR  --   --  1.1  --   --   --   --   --   --   AMMONIA  --  108*  --   --   --   --   --   --   --     ------------------------------------------------------------------------------------------------------------------ No results for input(s): CHOL, HDL, LDLCALC, TRIG, CHOLHDL, LDLDIRECT in the last 72 hours.  No results found for: HGBA1C ------------------------------------------------------------------------------------------------------------------ No results for input(s): TSH, T4TOTAL, T3FREE, THYROIDAB in the last 72 hours.  Invalid input(s): FREET3  Cardiac Enzymes No results for input(s): CKMB, TROPONINI, MYOGLOBIN in the last 168 hours.  Invalid input(s): CK ------------------------------------------------------------------------------------------------------------------ No results found for: BNP   Radiology Reports DG Ribs Unilateral W/Chest Right  Result Date: 03/07/2021 CLINICAL DATA:  Rib injury EXAM: RIGHT RIBS AND CHEST - 3+ VIEW COMPARISON:  None. FINDINGS: No pneumothorax or blunting of the costophrenic angles to suggest pleural effusion. Cardiac and mediastinal margins appear normal. The lungs appear clear. No pneumothorax. Marker noted projecting over the right lateral ribs in the vicinity of the fifth and sixth ribs. No discrete rib discontinuity is identified. IMPRESSION: 1. No well-defined rib injury is identified. The lungs appear clear. Electronically Signed   By: 05/07/2021 M.D.   On: 03/07/2021 12:06   CT HEAD WO CONTRAST (05/07/2021)  Result Date: 03/07/2021 CLINICAL DATA:  Mental status change. EXAM: CT HEAD WITHOUT CONTRAST TECHNIQUE: Contiguous axial images were obtained from the base of the skull through the vertex without intravenous contrast. COMPARISON:  CT head 04/05/2020. FINDINGS: Brain: No evidence of acute infarction, hemorrhage, hydrocephalus, extra-axial collection or mass  lesion/mass effect. Vascular: No hyperdense  vessel or unexpected calcification. Skull: Normal. Negative for fracture or focal lesion. Sinuses/Orbits: No acute finding. Other: None. IMPRESSION: No acute intracranial abnormality. Electronically Signed   By: Darliss Cheney M.D.   On: 03/07/2021 21:50   CT ABDOMEN PELVIS W CONTRAST  Result Date: 03/08/2021 CLINICAL DATA:  Abdominal pain EXAM: CT ABDOMEN AND PELVIS WITH CONTRAST TECHNIQUE: Multidetector CT imaging of the abdomen and pelvis was performed using the standard protocol following bolus administration of intravenous contrast. CONTRAST:  16mL OMNIPAQUE IOHEXOL 350 MG/ML SOLN COMPARISON:  None. FINDINGS: Lower chest: Lung bases are clear. Hepatobiliary: Liver is within normal limits. Gallbladder is unremarkable. No intrahepatic or extrahepatic ductal dilatation. Pancreas: Within normal limits. Spleen: Within normal limits. Adrenals/Urinary Tract: Adrenal glands are within normal limits. Kidneys are within normal limits.  No hydronephrosis. Bladder is within normal limits. Stomach/Bowel: Stomach is within normal limits. No evidence of bowel obstruction. Normal appendix (coronal image 46). No colonic wall thickening or inflammatory changes. Vascular/Lymphatic: No evidence of abdominal aortic aneurysm. No suspicious abdominopelvic lymphadenopathy. Reproductive: Prostate is unremarkable. Other: No abdominopelvic ascites. Musculoskeletal: Visualized osseous structures are within normal limits. IMPRESSION: Negative CT abdomen/pelvis. Electronically Signed   By: Charline Bills M.D.   On: 03/08/2021 03:40   DG Chest Port 1 View  Result Date: 03/09/2021 CLINICAL DATA:  Dyspnea EXAM: PORTABLE CHEST 1 VIEW COMPARISON:  Chest radiograph 03/08/2021 FINDINGS: The cardiomediastinal silhouette is stable. There is no focal consolidation or pulmonary edema. There is no pleural effusion or pneumothorax. There is no acute osseous abnormality. IMPRESSION: Stable chest with  no radiographic evidence of acute cardiopulmonary process. Electronically Signed   By: Lesia Hausen M.D.   On: 03/09/2021 08:09   DG CHEST PORT 1 VIEW  Result Date: 03/08/2021 CLINICAL DATA:  Shortness of breath EXAM: PORTABLE CHEST 1 VIEW COMPARISON:  03/07/2021 FINDINGS: The heart size and mediastinal contours are within normal limits. Both lungs are clear. The visualized skeletal structures are unremarkable. IMPRESSION: No active disease. Electronically Signed   By: Deatra Robinson M.D.   On: 03/08/2021 02:58

## 2021-03-11 NOTE — Progress Notes (Signed)
Physical Therapy Treatment Patient Details Name: Corey Anthony MRN: [id number] DOB: 1985/09/07 Today's Date: 03/11/2021   History of Present Illness 35 y.o. male presents to [facility] ED on 03/07/2021 with confusion, nausea and vomiting. Pt diagnosed with alcohol intoxication followed by severe DTs, admitted to ICU and started on precedex 9/13. PMH includes alcoholism and asthma.    PT Comments    Pt received seated EOB, agreeable to therapy session and with fair participation and tolerance for gait task in room. Pt deferring hallway gait trial or stair training due to c/o dizziness/headache, RN notified. Pt sister-in-law present who speaks [place] well and translating. Pt needing slightly increased assist (min guard) today due to pt dizziness/minor loss of balance. Pt continues to benefit from PT services to progress toward functional mobility goals.    Recommendations for follow up therapy are one component of a multi-disciplinary discharge planning process, led by the attending physician.  Recommendations may be updated based on patient status, additional functional criteria and insurance authorization.  Follow Up Recommendations  No PT follow up;Supervision for mobility/OOB     Equipment Recommendations  None recommended by PT    Recommendations for Other Services       Precautions / Restrictions Precautions Precautions: Fall Restrictions Weight Bearing Restrictions: No     Mobility  Bed Mobility Overal bed mobility: Independent             General bed mobility comments: pt up at EOB upon therapist entrance and needed only vcs due to impulsivity prior to return to bed    Transfers Overall transfer level: Needs assistance Equipment used: None Transfers: Sit to/from Stand Sit to Stand: Supervision         General transfer comment: cues for safety due to impulsivity, pt attempting to sit prior to bed rail being lowered and at times ignoring therapist  cues  Ambulation/Gait Ambulation/Gait assistance: Min guard Gait Distance (Feet): 35 Feet Assistive device: None Gait Pattern/deviations: Step-through pattern;Drifts right/left;Narrow base of support;Decreased stride length Gait velocity: functional Gait velocity interpretation: 1.31 - 2.62 ft/sec, indicative of limited community ambulator General Gait Details: Pt internally distracted vs language barrier, pt endorses dizziness after gait trial to/from bathroom for toileting but deferring futher gait trial; mild lateral instability but able to self-correct balance with min guard for safety   Stairs             Wheelchair Mobility    Modified Rankin (Stroke Patients Only)       Balance Overall balance assessment: Needs assistance Sitting-balance support: No upper extremity supported;Feet supported Sitting balance-Leahy Scale: Good Sitting balance - Comments: static sitting and weight shifting no LOB   Standing balance support: No upper extremity supported;During functional activity Standing balance-Leahy Scale: Fair Standing balance comment: static standing no LOB but needs min guard for room ambulation due to mild lateral LOB x3 occasions, pt c/o dizziness but deferring orthostatics or longer gait                            Cognition Arousal/Alertness: Awake/alert Behavior During Therapy: Impulsive Overall Cognitive Status: Impaired/Different from baseline Area of Impairment: Orientation;Attention;Memory;Following commands;Safety/judgement;Awareness;Problem solving                 Orientation Level: Disoriented to (difficult to assess, pt minimally verbal and requesting to sleep after toileting) Current Attention Level: Sustained Memory: Decreased recall of precautions;Decreased short-term memory Following Commands: Follows one step commands consistently;Follows multi-step commands inconsistently Safety/Judgement: Decreased  awareness of  safety;Decreased awareness of deficits Awareness: Intellectual Problem Solving: Difficulty sequencing;Requires verbal cues General Comments: pt participatory for goal-directed activity today (toileting/washing hands at sink), c/o dizziness and headache, encouraged further mobility/ambulation but pt declined, citing dizziness/fatigue.      Exercises      General Comments General comments (skin integrity, edema, etc.): SpO2 WNL on RA, HR 80's bpm resting and to 125 bpm with exertion; pt dizzy, headache      Pertinent Vitals/Pain Pain Assessment: Faces Faces Pain Scale: Hurts little more Pain Location: headache (pt pointing to R temple) Pain Descriptors / Indicators: Headache Pain Intervention(s): Limited activity within patient's tolerance;Monitored during session;Repositioned;Patient requesting pain meds-RN notified    Home Living                      Prior Function            PT Goals (current goals can now be found in the care plan section) Acute Rehab PT Goals Patient Stated Goal: to return to work PT Goal Formulation: With patient Time For Goal Achievement: 03/24/21 Potential to Achieve Goals: Good Progress towards PT goals: Progressing toward goals    Frequency    Min 3X/week      PT Plan Current plan remains appropriate    Co-evaluation              AM-PAC PT "6 Clicks" Mobility   Outcome Measure  Help needed turning from your back to your side while in a flat bed without using bedrails?: None Help needed moving from lying on your back to sitting on the side of a flat bed without using bedrails?: None Help needed moving to and from a bed to a chair (including a wheelchair)?: A Little Help needed standing up from a chair using your arms (e.g., wheelchair or bedside chair)?: A Little Help needed to walk in hospital room?: A Little Help needed climbing 3-5 steps with a railing? : A Little 6 Click Score: 20    End of Session   Activity  Tolerance: Patient limited by pain;Other (comment) (headache/dizziness sx limiting) Patient left: in bed;with call bell/phone within reach;with family/visitor present;with bed alarm set (sister-in-law (speaks english), sister present in room) Nurse Communication: Mobility status PT Visit Diagnosis: Other abnormalities of gait and mobility (R26.89)     Time: 4627-0350 PT Time Calculation (min) (ACUTE ONLY): 16 min  Charges:  $Gait Training: 8-22 mins                     Sonora Catlin P., PTA Acute Rehabilitation Services Pager: [phone] Office: [phone]    Dorathy Kinsman Maleny Candy 03/11/2021, 3:08 PM

## 2021-03-11 NOTE — Progress Notes (Signed)
Sister-in-law Jaclynn Guarneri Meh wants to be contacted by MD tomorrow  morning. This is the only fluent english speaking family member. She would like an update on his prognosis and information concerning his plan of care.

## 2021-03-12 LAB — COMPREHENSIVE METABOLIC PANEL
ALT: 296 U/L — ABNORMAL HIGH (ref 0–44)
AST: 240 U/L — ABNORMAL HIGH (ref 15–41)
Albumin: 3.2 g/dL — ABNORMAL LOW (ref 3.5–5.0)
Alkaline Phosphatase: 117 U/L (ref 38–126)
Anion gap: 8 (ref 5–15)
BUN: 7 mg/dL (ref 6–20)
CO2: 29 mmol/L (ref 22–32)
Calcium: 9.7 mg/dL (ref 8.9–10.3)
Chloride: 101 mmol/L (ref 98–111)
Creatinine, Ser: 0.69 mg/dL (ref 0.61–1.24)
GFR, Estimated: >60 mL/min (ref >60)
Glucose, Bld: 126 mg/dL — ABNORMAL HIGH (ref 70–99)
Potassium: 5 mmol/L (ref 3.5–5.1)
Sodium: 138 mmol/L (ref 135–145)
Total Bilirubin: 0.9 mg/dL (ref 0.3–1.2)
Total Protein: 7.1 g/dL (ref 6.5–8.1)

## 2021-03-12 LAB — CBC WITH DIFFERENTIAL/PLATELET
Abs Immature Granulocytes: 0.05 10*3/uL (ref 0.00–0.07)
Basophils Absolute: 0.1 10*3/uL (ref 0.0–0.1)
Basophils Relative: 2 %
Eosinophils Absolute: 0.1 10*3/uL (ref 0.0–0.5)
Eosinophils Relative: 2 %
HCT: 39.8 % (ref 39.0–52.0)
Hemoglobin: 13.4 g/dL (ref 13.0–17.0)
Immature Granulocytes: 1 %
Lymphocytes Relative: 24 %
Lymphs Abs: 1.5 10*3/uL (ref 0.7–4.0)
MCH: 30.3 pg (ref 26.0–34.0)
MCHC: 33.7 g/dL (ref 30.0–36.0)
MCV: 90 fL (ref 80.0–100.0)
Monocytes Absolute: 1 10*3/uL (ref 0.1–1.0)
Monocytes Relative: 16 %
Neutro Abs: 3.2 10*3/uL (ref 1.7–7.7)
Neutrophils Relative %: 55 %
Platelets: 389 10*3/uL (ref 150–400)
RBC: 4.42 MIL/uL (ref 4.22–5.81)
RDW: 13.7 % (ref 11.5–15.5)
WBC: 5.9 10*3/uL (ref 4.0–10.5)
nRBC: 0 % (ref 0.0–0.2)

## 2021-03-12 LAB — MAGNESIUM: Magnesium: 1.7 mg/dL (ref 1.7–2.4)

## 2021-03-12 MED ORDER — DEXTROSE IN LACTATED RINGERS 5 % IV SOLN: INTRAVENOUS | Status: DC

## 2021-03-12 MED ORDER — DEXTROSE IN LACTATED RINGERS 5 % IV SOLN: INTRAVENOUS | Status: AC

## 2021-03-12 MED ORDER — CARVEDILOL 3.125 MG PO TABS
3.1250 mg | ORAL_TABLET | Freq: Two times a day (BID) | ORAL | Status: DC
  Administered 2021-03-12 – 2021-03-13 (×2): 3.125 mg via ORAL
  Filled 2021-03-12 (×2): qty 1

## 2021-03-12 NOTE — Progress Notes (Signed)
Physical Therapy Treatment & Discharge Patient Details Name: Corey Anthony MRN: [id number] DOB: Jan 07, 1986 Today's Date: 03/12/2021   History of Present Illness Pt is a 35 y.o. male admitted 03/07/2021 with confusion, nausea and vomiting. Workup for alcohol intoxication followed by severe DTs, alcoholic hepatitis requiring intial ICU admission. PMH includes alcoholism, asthma.   PT Comments    Pt progressing well with mobility. Independent with transfers, ambulation and stair training this session; no overt instability or LOB noted with higher level balance tasks. Pt denies pain. Pt has met short-term acute PT goals, will d/c acute PT.    Recommendations for follow up therapy are one component of a multi-disciplinary discharge planning process, led by the attending physician.  Recommendations may be updated based on patient status, additional functional criteria and insurance authorization.  Follow Up Recommendations  No PT follow up;Supervision - Intermittent     Equipment Recommendations  None recommended by PT    Recommendations for Other Services       Precautions / Restrictions Precautions Precautions: None Restrictions Weight Bearing Restrictions: No     Mobility  Bed Mobility Overal bed mobility: Independent                  Transfers Overall transfer level: Independent Equipment used: None Transfers: Sit to/from Stand              Ambulation/Gait Ambulation/Gait assistance: Independent Social research officer, government (Feet): 200 Feet Assistive device: None;IV Pole Gait Pattern/deviations: Step-through pattern;Decreased stride length     General Gait Details: Slow, steady gait with and without pushing IV pole; independent; no overt instability or LOB noted   Stairs Stairs: Yes Stairs assistance: Modified independent (Device/Increase time) Stair Management: One rail Right;Alternating pattern;Forwards Number of Stairs: 2 General stair comments: ascend/descend 2 steps  mod indep with rail support   Wheelchair Mobility    Modified Rankin (Stroke Patients Only)       Balance Overall balance assessment: No apparent balance deficits (not formally assessed)   Sitting balance-Leahy Scale: Normal Sitting balance - Comments: indep to don flip flops sitting EOB     Standing balance-Leahy Scale: Good               High level balance activites: Side stepping;Backward walking;Direction changes;Turns;Sudden stops;Head turns High Level Balance Comments: no overt instability or LOB noted with higher level balance tasks            Cognition Arousal/Alertness: Awake/alert Behavior During Therapy: WFL for tasks assessed/performed Overall Cognitive Status: Within Functional Limits for tasks assessed                                 General Comments: WFL for simple tasks via basic English (video and telephone interpreter not available this session); pt following gestural directions appropriately, good awareness of lines      Exercises      General Comments        Pertinent Vitals/Pain Pain Assessment: No/denies pain Pain Intervention(s): Monitored during session    Home Living                      Prior Function            PT Goals (current goals can now be found in the care plan section) Progress towards PT goals: Goals met/education completed, patient discharged from PT    Frequency    Min 3X/week  PT Plan Discharge plan needs to be updated    Co-evaluation              AM-PAC PT "6 Clicks" Mobility   Outcome Measure  Help needed turning from your back to your side while in a flat bed without using bedrails?: None Help needed moving from lying on your back to sitting on the side of a flat bed without using bedrails?: None Help needed moving to and from a bed to a chair (including a wheelchair)?: None Help needed standing up from a chair using your arms (e.g., wheelchair or bedside chair)?:  None Help needed to walk in hospital room?: None Help needed climbing 3-5 steps with a railing? : None 6 Click Score: 24    End of Session   Activity Tolerance: Patient tolerated treatment well Patient left: in bed;with call bell/phone within reach Nurse Communication: Mobility status PT Visit Diagnosis: Other abnormalities of gait and mobility (R26.89)     Time: 8266-6648 PT Time Calculation (min) (ACUTE ONLY): 12 min  Charges:  $Gait Training: 8-22 mins                     Mabeline Caras, PT, DPT Acute Rehabilitation Services  Pager [phone] Office [place] 03/12/2021, 11:44 AM

## 2021-03-12 NOTE — Progress Notes (Addendum)
PROGRESS NOTE                                                                                                                                                                                                             Patient Demographics:    Corey Anthony, is a 35 y.o. male, DOB - December 06, 1985, OEU:[id number]  Outpatient Primary MD for the patient is Department, [facility]    LOS - 4  Admit date - 03/07/2021    Chief Complaint  Patient presents with   Chest Pain   Alcohol Problem       Brief Narrative (HPI from H&P)  Corey Anthony is a 35 y.o. male never smoker with history of alcoholism and history of asthma presently not on any medication and on 03/07/2021 has been having increasing confusion with nausea vomiting and epigastric discomfort for several days, he was diagnosed with initially alcohol intoxication followed by severe DTs, he was admitted to the ICU on Precedex drip, he was stabilized and transferred to hospitalist service on 03/10/2021.  GI has also seen the patient this admission.   Subjective:   Patient in bed, appears comfortable, denies any headache, no fever, no chest pain or pressure, no shortness of breath , no abdominal pain. No new focal weakness.   Assessment  & Plan :   Alcohol abuse and DTs.  With mild alcoholic hepatitis.  Initially treated in ICU on Precedex drip, currently on CIWA protocol now went into severe DTs again night of 03/10/21 - Librium + CIWA, IVF - restraints PRN for severe agitation.  Slightly better on 03/12/2021 continue supportive care.  2.  Hyponatremia and hypomagnesemia.  IVF, K and Mag Replaced.  3.  Alcoholic hepatitis.  Mild.  Stable synthetic function, INR stable, GI on board.  Monitor.  Trend is improving.      Condition - Fair  Family Communication  :  [phone] Isabella Meh - 03/12/21 at 10.58 am message left  Code Status :  Full  Consults  :  GI, PCCM  PUD  Prophylaxis : PPI   Procedures  :      CT Head & CT Abd - Pelvis - Non acute      Disposition Plan  :    Status is: Inpatient  Remains inpatient appropriate because:IV treatments appropriate due to intensity  of illness or inability to take PO  Dispo: The patient is from: Home              Anticipated d/c is to: Home              Patient currently is not medically stable to d/c.   Difficult to place patient No  DVT Prophylaxis  :    heparin injection 5,000 Units Start: 03/08/21 1400 SCDs Start: 03/08/21 1254  Lab Results  Component Value Date   PLT 389 03/12/2021    Diet :  Diet Order             DIET SOFT Room service appropriate? Yes; Fluid consistency: Nectar Thick  Diet effective now                    Inpatient Medications  Scheduled Meds:  chlordiazePOXIDE  15 mg Oral TID   Chlorhexidine Gluconate Cloth  6 each Topical Daily   folic acid  1 mg Intravenous Daily   Or   folic acid  1 mg Oral Daily   heparin injection (subcutaneous)  5,000 Units Subcutaneous Q8H   influenza vac split quadrivalent PF  0.5 mL Intramuscular Tomorrow-1000   multivitamin with minerals  1 tablet Oral Daily   pantoprazole  40 mg Oral Q0600   pneumococcal 23 valent vaccine  0.5 mL Intramuscular Tomorrow-1000   [START ON 03/13/2021] thiamine injection  100 mg Intravenous Daily   Continuous Infusions:  dextrose 5% lactated ringers 75 mL/hr at 03/12/21 0744   PRN Meds:.docusate sodium, LORazepam **OR** LORazepam, ondansetron (ZOFRAN) IV, polyethylene glycol  Antibiotics  :    Anti-infectives (From admission, onward)    None        Time Spent in minutes  30   Susa Raring M.D on 03/12/2021 at 10:35 AM  To page go to [email or web address]   [facility] -  Office  [phone]    See all Orders from today for further details    Objective:   Vitals:   03/11/21 2340 03/12/21 0408 03/12/21 0500 03/12/21 0800  BP: (!) 142/106 (!) 132/94  (!) 138/94  Pulse: 91 90   75  Resp: 18   18  Temp: 98.7 F (37.1 C) 98.2 F (36.8 C)  99 F (37.2 C)  TempSrc: Oral Oral  Oral  SpO2: 100% 100%  100%  Weight:   58 kg     Wt Readings from Last 3 Encounters:  03/12/21 58 kg  11/14/18 54.4 kg  11/07/18 53.5 kg     Intake/Output Summary (Last 24 hours) at 03/12/2021 1035 Last data filed at 03/12/2021 0315 Gross per 24 hour  Intake 326.62 ml  Output 600 ml  Net -273.38 ml     Physical Exam  Awake less confused on 03/12/2021, no new F.N deficits,  [place].AT,PERRAL Supple Neck,No JVD, No cervical lymphadenopathy appriciated.  Symmetrical Chest wall movement, Good air movement bilaterally, CTAB RRR,No Gallops, Rubs or new Murmurs, No Parasternal Heave +ve B.Sounds, Abd Soft, No tenderness, No organomegaly appriciated, No rebound - guarding or rigidity. No Cyanosis, Clubbing or edema, No new Rash or bruise       Data Review:    CBC Recent Labs  Lab 03/07/21 1131 03/08/21 0219 03/09/21 0040 03/10/21 0326 03/11/21 0312 03/12/21 0149  WBC 6.7 5.6 7.7 9.1 6.7 5.9  HGB 14.4 12.3* 13.0 13.6 13.4 13.4  HCT 40.3 35.0* 38.8* 38.8* 39.1 39.8  PLT 159 149* 170 224 309 389  MCV  85.2 86.8 88.6 86.8 88.1 90.0  MCH 30.4 30.5 29.7 30.4 30.2 30.3  MCHC 35.7 35.1 33.5 35.1 34.3 33.7  RDW 12.5 12.8 13.0 13.3 13.4 13.7  LYMPHSABS 0.7 0.9  --   --  1.9 1.5  MONOABS 1.2* 1.2*  --   --  1.5* 1.0  EOSABS 0.0 0.0  --   --  0.1 0.1  BASOSABS 0.1 0.0  --   --  0.1 0.1    Recent Labs  Lab 03/07/21 2035 03/07/21 2036 03/08/21 0219 03/08/21 0755 03/08/21 0848 03/09/21 0040 03/09/21 0902 03/10/21 0326 03/11/21 0312 03/12/21 0149  NA  --   --  130*   < > 129* 132* 132*  --  134* 138  K  --   --  3.2*   < > 3.4* 3.8 3.6  --  3.6 5.0  CL  --   --  95*   < > 93* 99 94*  --  97* 101  CO2  --   --  26   < > 24 25 27   --  25 29  GLUCOSE  --   --  148*   < > 210* 81 88  --  80 126*  BUN  --   --  5*   < > 5* <5* <5*  --  5* 7  CREATININE  --   --  0.61   < >  0.69 0.49* 0.65  --  0.57* 0.69  CALCIUM  --   --  8.8*   < > 8.9 9.3 9.0  --  10.0 9.7  AST 487*  --  412*  --   --   --  447*  --  186* 240*  ALT 369*  --  309*  --   --   --  383*  --  242* 296*  ALKPHOS 119  --  99  --   --   --  101  --  108 117  BILITOT 2.6*  --  2.1*  --   --   --  1.7*  --  1.3* 0.9  ALBUMIN 3.9  --  3.1*  --   --   --  3.4*  --  3.4* 3.2*  MG  --   --   --   --  1.6* 1.5*  --  2.0 2.0 1.7  INR  --   --  1.1  --   --   --   --   --   --   --   AMMONIA  --  108*  --   --   --   --   --   --   --   --    < > = values in this interval not displayed.    ------------------------------------------------------------------------------------------------------------------ No results for input(s): CHOL, HDL, LDLCALC, TRIG, CHOLHDL, LDLDIRECT in the last 72 hours.  No results found for: HGBA1C ------------------------------------------------------------------------------------------------------------------ No results for input(s): TSH, T4TOTAL, T3FREE, THYROIDAB in the last 72 hours.  Invalid input(s): FREET3  Cardiac Enzymes No results for input(s): CKMB, TROPONINI, MYOGLOBIN in the last 168 hours.  Invalid input(s): CK ------------------------------------------------------------------------------------------------------------------ No results found for: BNP   Radiology Reports DG Ribs Unilateral W/Chest Right  Result Date: 03/07/2021 CLINICAL DATA:  Rib injury EXAM: RIGHT RIBS AND CHEST - 3+ VIEW COMPARISON:  None. FINDINGS: No pneumothorax or blunting of the costophrenic angles to suggest pleural effusion. Cardiac and mediastinal margins appear normal. The lungs appear clear. No pneumothorax. Marker  noted projecting over the right lateral ribs in the vicinity of the fifth and sixth ribs. No discrete rib discontinuity is identified. IMPRESSION: 1. No well-defined rib injury is identified. The lungs appear clear. Electronically Signed   By: Gaylyn Rong M.D.   On:  03/07/2021 12:06   CT HEAD WO CONTRAST ( )  Result Date: 03/07/2021 CLINICAL DATA:  Mental status change. EXAM: CT HEAD WITHOUT CONTRAST TECHNIQUE: Contiguous axial images were obtained from the base of the skull through the vertex without intravenous contrast. COMPARISON:  CT head 04/05/2020. FINDINGS: Brain: No evidence of acute infarction, hemorrhage, hydrocephalus, extra-axial collection or mass lesion/mass effect. Vascular: No hyperdense vessel or unexpected calcification. Skull: Normal. Negative for fracture or focal lesion. Sinuses/Orbits: No acute finding. Other: None. IMPRESSION: No acute intracranial abnormality. Electronically Signed   By: Darliss Cheney M.D.   On: 03/07/2021 21:50   CT ABDOMEN PELVIS W CONTRAST  Result Date: 03/08/2021 CLINICAL DATA:  Abdominal pain EXAM: CT ABDOMEN AND PELVIS WITH CONTRAST TECHNIQUE: Multidetector CT imaging of the abdomen and pelvis was performed using the standard protocol following bolus administration of intravenous contrast. CONTRAST:  87mL OMNIPAQUE IOHEXOL 350 MG/ML SOLN COMPARISON:  None. FINDINGS: Lower chest: Lung bases are clear. Hepatobiliary: Liver is within normal limits. Gallbladder is unremarkable. No intrahepatic or extrahepatic ductal dilatation. Pancreas: Within normal limits. Spleen: Within normal limits. Adrenals/Urinary Tract: Adrenal glands are within normal limits. Kidneys are within normal limits.  No hydronephrosis. Bladder is within normal limits. Stomach/Bowel: Stomach is within normal limits. No evidence of bowel obstruction. Normal appendix (coronal image 46). No colonic wall thickening or inflammatory changes. Vascular/Lymphatic: No evidence of abdominal aortic aneurysm. No suspicious abdominopelvic lymphadenopathy. Reproductive: Prostate is unremarkable. Other: No abdominopelvic ascites. Musculoskeletal: Visualized osseous structures are within normal limits. IMPRESSION: Negative CT abdomen/pelvis. Electronically Signed   By:  Charline Bills M.D.   On: 03/08/2021 03:40   DG Chest Port 1 View  Result Date: 03/09/2021 CLINICAL DATA:  Dyspnea EXAM: PORTABLE CHEST 1 VIEW COMPARISON:  Chest radiograph 03/08/2021 FINDINGS: The cardiomediastinal silhouette is stable. There is no focal consolidation or pulmonary edema. There is no pleural effusion or pneumothorax. There is no acute osseous abnormality. IMPRESSION: Stable chest with no radiographic evidence of acute cardiopulmonary process. Electronically Signed   By: Lesia Hausen M.D.   On: 03/09/2021 08:09   DG CHEST PORT 1 VIEW  Result Date: 03/08/2021 CLINICAL DATA:  Shortness of breath EXAM: PORTABLE CHEST 1 VIEW COMPARISON:  03/07/2021 FINDINGS: The heart size and mediastinal contours are within normal limits. Both lungs are clear. The visualized skeletal structures are unremarkable. IMPRESSION: No active disease. Electronically Signed   By: Deatra Robinson M.D.   On: 03/08/2021 02:58

## 2021-03-13 LAB — CBC WITH DIFFERENTIAL/PLATELET
Abs Immature Granulocytes: 0.06 10*3/uL (ref 0.00–0.07)
Basophils Absolute: 0.1 10*3/uL (ref 0.0–0.1)
Basophils Relative: 1 %
Eosinophils Absolute: 0.1 10*3/uL (ref 0.0–0.5)
Eosinophils Relative: 2 %
HCT: 38.4 % — ABNORMAL LOW (ref 39.0–52.0)
Hemoglobin: 13.1 g/dL (ref 13.0–17.0)
Immature Granulocytes: 1 %
Lymphocytes Relative: 19 %
Lymphs Abs: 1.2 10*3/uL (ref 0.7–4.0)
MCH: 30.4 pg (ref 26.0–34.0)
MCHC: 34.1 g/dL (ref 30.0–36.0)
MCV: 89.1 fL (ref 80.0–100.0)
Monocytes Absolute: 1.3 10*3/uL — ABNORMAL HIGH (ref 0.1–1.0)
Monocytes Relative: 22 %
Neutro Abs: 3.3 10*3/uL (ref 1.7–7.7)
Neutrophils Relative %: 55 %
Platelets: 418 10*3/uL — ABNORMAL HIGH (ref 150–400)
RBC: 4.31 MIL/uL (ref 4.22–5.81)
RDW: 13.4 % (ref 11.5–15.5)
WBC: 6 10*3/uL (ref 4.0–10.5)
nRBC: 0 % (ref 0.0–0.2)

## 2021-03-13 LAB — COMPREHENSIVE METABOLIC PANEL
ALT: 350 U/L — ABNORMAL HIGH (ref 0–44)
AST: 327 U/L — ABNORMAL HIGH (ref 15–41)
Albumin: 3.3 g/dL — ABNORMAL LOW (ref 3.5–5.0)
Alkaline Phosphatase: 109 U/L (ref 38–126)
Anion gap: 8 (ref 5–15)
BUN: 6 mg/dL (ref 6–20)
CO2: 26 mmol/L (ref 22–32)
Calcium: 9.7 mg/dL (ref 8.9–10.3)
Chloride: 100 mmol/L (ref 98–111)
Creatinine, Ser: 0.54 mg/dL — ABNORMAL LOW (ref 0.61–1.24)
GFR, Estimated: >60 mL/min (ref >60)
Glucose, Bld: 107 mg/dL — ABNORMAL HIGH (ref 70–99)
Potassium: 4.1 mmol/L (ref 3.5–5.1)
Sodium: 134 mmol/L — ABNORMAL LOW (ref 135–145)
Total Bilirubin: 0.8 mg/dL (ref 0.3–1.2)
Total Protein: 7.3 g/dL (ref 6.5–8.1)

## 2021-03-13 LAB — MAGNESIUM: Magnesium: 1.6 mg/dL — ABNORMAL LOW (ref 1.7–2.4)

## 2021-03-13 MED ORDER — CARVEDILOL 3.125 MG PO TABS: 3.1250 mg | ORAL_TABLET | Freq: Two times a day (BID) | ORAL | 0 refills | Status: AC

## 2021-03-13 MED ORDER — CHLORDIAZEPOXIDE HCL 5 MG PO CAPS: 10.0000 mg | ORAL_CAPSULE | Freq: Three times a day (TID) | ORAL | Status: DC

## 2021-03-13 MED ORDER — THIAMINE HCL 100 MG PO TABS: 100.0000 mg | ORAL_TABLET | Freq: Every day | ORAL | 0 refills | Status: AC

## 2021-03-13 MED ORDER — CHLORDIAZEPOXIDE HCL 5 MG PO CAPS: ORAL_CAPSULE | ORAL | 0 refills | Status: AC

## 2021-03-13 MED ORDER — FOLIC ACID 1 MG PO TABS: 1.0000 mg | ORAL_TABLET | Freq: Every day | ORAL | 0 refills | Status: AC

## 2021-03-13 MED ORDER — MAGNESIUM SULFATE 2 GM/50ML IV SOLN
2.0000 g | Freq: Once | INTRAVENOUS | Status: AC
  Administered 2021-03-13: 2 g via INTRAVENOUS
  Filled 2021-03-13: qty 50

## 2021-03-13 NOTE — Progress Notes (Signed)
                                      [facility]                            [address] [place], [place] [address]      Corey Anthony was admitted to the Hospital on 03/07/2021 and Discharged  03/13/2021 and should be excused from work/school   for  7  days starting from date -  03/07/2021 , may return to work/school without any restrictions.  Call Susa Raring MD, [facility]  [phone] with questions.  Susa Raring M.D on 03/13/2021,at 10:22 AM  [facility]   Office  [phone]

## 2021-03-13 NOTE — TOC Transition Note (Signed)
Transition of Care [facility]) - CM/SW Discharge Note   Patient Details  Name: Corey Anthony MRN: [id number] Date of Birth: [birth year]  [facility] of Care [facility]) CM/SW Contact:  Lawerance Sabal, RN Phone Number: 03/13/2021, 9:44 AM   Clinical Narrative:    Notified nurse that I left taxi voucher and MATCH letter with unit secretary. Requested she read [facility] letter to patient while she has interpreter when doing DC instructions. Informed her that patient should also be sent home with his printed prescriptions.       Barriers to Discharge: Continued Medical Work up, Inadequate or no insurance   Patient Goals and CMS Choice        Discharge Placement                       Discharge Plan and Services   Discharge Planning Services: CM Consult                                 Social Determinants of Health (SDOH) Interventions     Readmission Risk Interventions No flowsheet data found.

## 2021-03-13 NOTE — Discharge Summary (Signed)
Corey Anthony WUJ:[id number] DOB: 17-Apr-1986 DOA: 03/07/2021  PCP: Department, [facility]  Admit date: 03/07/2021  Discharge date: 03/13/2021  Admitted From: Home   Disposition:  Home   Recommendations for Outpatient Follow-up:   Follow up with PCP in 1-2 weeks  PCP Please obtain BMP/CBC, 2 view CXR in 1week,  (see Discharge instructions)   PCP Please follow up on the following pending results:    Home Health: None   Equipment/Devices: None  Consultations: GI, PCCM Discharge Condition: Stable    CODE STATUS: Full    Diet Recommendation: Heart Healthy   Diet Order             Diet - low sodium heart healthy           DIET SOFT Room service appropriate? Yes; Fluid consistency: Nectar Thick  Diet effective now                    Chief Complaint  Patient presents with   Chest Pain   Alcohol Problem     Brief history of present illness from the day of admission and additional interim summary    Corey Anthony is a 35 y.o. male never smoker with history of alcoholism and history of asthma presently not on any medication and on 03/07/2021 has been having increasing confusion with nausea vomiting and epigastric discomfort for several days, he was diagnosed with initially alcohol intoxication followed by severe DTs, he was admitted to the ICU on Precedex drip, he was stabilized and transferred to hospitalist service on 03/10/2021.  GI has also seen the patient this admission.                                                                 Hospital Course    Alcohol abuse and DTs.  With mild alcoholic hepatitis.  Initially treated in ICU on Precedex drip, subsequently transition to CIWA protocol, DTs much better and now close to baseline eager to go home, symptom-free counseled to quit alcohol, will be  discharged home on Librium drip, oral folic acid and thiamine with outpatient PCP and GI follow-up.   2.  Hyponatremia and hypomagnesemia.  IVF, K and Mag Replaced.   3.  Alcoholic hepatitis.  Mild.  Stable synthetic function, INR stable, GI on board.  Stable, will follow with GI and PCP postdischarge   Discharge diagnosis     Principal Problem:   Acute encephalopathy Active Problems:   Hyponatremia   Elevated LFTs   Epigastric pain   Alcoholism (HCC)   Delirium tremens (HCC)   Alcohol withdrawal delirium (HCC)    Discharge instructions    Discharge Instructions     Diet - low sodium heart healthy   Complete by: As directed    Discharge instructions  Complete by: As directed    Follow with Primary MD Department, [facility] in 7 days   Get CBC, CMP, Magnesium -  checked next visit within 1 week by Primary MD    Activity: As tolerated with Full fall precautions use walker/cane & assistance as needed  Disposition Home    Diet: Heart Healthy    Special Instructions: If you have smoked or chewed Tobacco  in the last 2 yrs please stop smoking, stop any regular Alcohol  and or any Recreational drug use.  On your next visit with your primary care physician please Get Medicines reviewed and adjusted.  Please request your Prim.MD to go over all Hospital Tests and Procedure/Radiological results at the follow up, please get all Hospital records sent to your Prim MD by signing hospital release before you go home.  If you experience worsening of your admission symptoms, develop shortness of breath, life threatening emergency, suicidal or homicidal thoughts you must seek medical attention immediately by calling 911 or calling your MD immediately  if symptoms less severe.  You Must read complete instructions/literature along with all the possible adverse reactions/side effects for all the Medicines you take and that have been prescribed to you. Take any new Medicines  after you have completely understood and accpet all the possible adverse reactions/side effects.   Increase activity slowly   Complete by: As directed        Discharge Medications   Allergies as of 03/13/2021       Reactions   Squid Oil Rash        Medication List     STOP taking these medications    diphenhydrAMINE-PE-APAP 12.5-5-325 MG/15ML Liqd       TAKE these medications    carvedilol 3.125 MG tablet Commonly known as: COREG Take 1 tablet (3.125 mg total) by mouth 2 (two) times daily with a meal.   chlordiazePOXIDE 5 MG capsule Commonly known as: LIBRIUM Please dispense 18 pills - Take 1 pill three times a day for 3 days, then Take 1 pill two times a day for 3 days, then Take 1 pill once a day for 3 days and stop.   folic acid 1 MG tablet Commonly known as: FOLVITE Take 1 tablet (1 mg total) by mouth daily. Start taking on: March 14, 2021   thiamine 100 MG tablet Take 1 tablet (100 mg total) by mouth daily.         Follow-up Information     Corey Burn, MD Follow up on 04/13/2021.   Specialty: Gastroenterology Why: Appointment at 1:30 pm. Please arrive 10 minutes early. Contact information: [address] [place] [address] [phone]         Department, [facility]. Schedule an appointment as soon as possible for a visit in 1 week(s).   Contact information: [address] [place] [address] [phone]                 Major procedures and Radiology Reports - PLEASE review detailed and final reports thoroughly  -       DG Ribs Unilateral W/Chest Right  Result Date: 03/07/2021 CLINICAL DATA:  Rib injury EXAM: RIGHT RIBS AND CHEST - 3+ VIEW COMPARISON:  None. FINDINGS: No pneumothorax or blunting of the costophrenic angles to suggest pleural effusion. Cardiac and mediastinal margins appear normal. The lungs appear clear. No pneumothorax. Marker noted projecting over the right lateral ribs in the  vicinity of the  fifth and sixth ribs. No discrete rib discontinuity is identified. IMPRESSION: 1. No well-defined rib injury is identified. The lungs appear clear. Electronically Signed   By: Gaylyn Rong M.D.   On: 03/07/2021 12:06   CT HEAD WO CONTRAST ( )  Result Date: 03/07/2021 CLINICAL DATA:  Mental status change. EXAM: CT HEAD WITHOUT CONTRAST TECHNIQUE: Contiguous axial images were obtained from the base of the skull through the vertex without intravenous contrast. COMPARISON:  CT head 04/05/2020. FINDINGS: Brain: No evidence of acute infarction, hemorrhage, hydrocephalus, extra-axial collection or mass lesion/mass effect. Vascular: No hyperdense vessel or unexpected calcification. Skull: Normal. Negative for fracture or focal lesion. Sinuses/Orbits: No acute finding. Other: None. IMPRESSION: No acute intracranial abnormality. Electronically Signed   By: Darliss Cheney M.D.   On: 03/07/2021 21:50   CT ABDOMEN PELVIS W CONTRAST  Result Date: 03/08/2021 CLINICAL DATA:  Abdominal pain EXAM: CT ABDOMEN AND PELVIS WITH CONTRAST TECHNIQUE: Multidetector CT imaging of the abdomen and pelvis was performed using the standard protocol following bolus administration of intravenous contrast. CONTRAST:  58mL OMNIPAQUE IOHEXOL 350 MG/ML SOLN COMPARISON:  None. FINDINGS: Lower chest: Lung bases are clear. Hepatobiliary: Liver is within normal limits. Gallbladder is unremarkable. No intrahepatic or extrahepatic ductal dilatation. Pancreas: Within normal limits. Spleen: Within normal limits. Adrenals/Urinary Tract: Adrenal glands are within normal limits. Kidneys are within normal limits.  No hydronephrosis. Bladder is within normal limits. Stomach/Bowel: Stomach is within normal limits. No evidence of bowel obstruction. Normal appendix (coronal image 46). No colonic wall thickening or inflammatory changes. Vascular/Lymphatic: No evidence of abdominal aortic aneurysm. No suspicious abdominopelvic  lymphadenopathy. Reproductive: Prostate is unremarkable. Other: No abdominopelvic ascites. Musculoskeletal: Visualized osseous structures are within normal limits. IMPRESSION: Negative CT abdomen/pelvis. Electronically Signed   By: Charline Bills M.D.   On: 03/08/2021 03:40   DG Chest Port 1 View  Result Date: 03/09/2021 CLINICAL DATA:  Dyspnea EXAM: PORTABLE CHEST 1 VIEW COMPARISON:  Chest radiograph 03/08/2021 FINDINGS: The cardiomediastinal silhouette is stable. There is no focal consolidation or pulmonary edema. There is no pleural effusion or pneumothorax. There is no acute osseous abnormality. IMPRESSION: Stable chest with no radiographic evidence of acute cardiopulmonary process. Electronically Signed   By: Lesia Hausen M.D.   On: 03/09/2021 08:09   DG CHEST PORT 1 VIEW  Result Date: 03/08/2021 CLINICAL DATA:  Shortness of breath EXAM: PORTABLE CHEST 1 VIEW COMPARISON:  03/07/2021 FINDINGS: The heart size and mediastinal contours are within normal limits. Both lungs are clear. The visualized skeletal structures are unremarkable. IMPRESSION: No active disease. Electronically Signed   By: Deatra Robinson M.D.   On: 03/08/2021 02:58      Today   Subjective    Corey Anthony today has no headache,no chest abdominal pain,no new weakness tingling or numbness, feels much better wants to go home today.     Objective   Blood pressure 130/90 pulse 70, temperature 97.9 F (36.6 C), temperature source Oral, resp. rate 20, weight 55.8 kg, SpO2 100 %.   Intake/Output Summary (Last 24 hours) at 03/13/2021 0920 Last data filed at 03/12/2021 2201 Gross per 24 hour  Intake 480 ml  Output --  Net 480 ml    Exam  Awake Alert, No new F.N deficits, Normal affect [place].AT,PERRAL Supple Neck,No JVD, No cervical lymphadenopathy appriciated.  Symmetrical Chest wall movement, Good air movement bilaterally, CTAB RRR,No Gallops,Rubs or new Murmurs, No Parasternal Heave +ve B.Sounds, Abd Soft, Non tender, No  organomegaly appriciated, No rebound -guarding or rigidity.  No Cyanosis, Clubbing or edema, No new Rash or bruise   Data Review   CBC w Diff:  Lab Results  Component Value Date   WBC 6.0 03/13/2021   HGB 13.1 03/13/2021   HCT 38.4 (L) 03/13/2021   PLT 418 (H) 03/13/2021   LYMPHOPCT 19 03/13/2021   MONOPCT 22 03/13/2021   EOSPCT 2 03/13/2021   BASOPCT 1 03/13/2021    CMP:  Lab Results  Component Value Date   NA 134 (L) 03/13/2021   K 4.1 03/13/2021   CL 100 03/13/2021   CO2 26 03/13/2021   BUN 6 03/13/2021   CREATININE 0.54 (L) 03/13/2021   PROT 7.3 03/13/2021   ALBUMIN 3.3 (L) 03/13/2021   BILITOT 0.8 03/13/2021   ALKPHOS 109 03/13/2021   AST 327 (H) 03/13/2021   ALT 350 (H) 03/13/2021  .   Total Time in preparing paper work, data evaluation and todays exam - 35 minutes  Corey Anthony M.D on 03/13/2021 at 9:20 AM  [facility]

## 2021-03-13 NOTE — Discharge Instructions (Signed)
Follow with Primary MD Department, [facility] in 7 days   Get CBC, CMP, 2 view Chest X ray -  checked next visit within 1 week by Primary MD    Activity: As tolerated with Full fall precautions use walker/cane & assistance as needed  Disposition Home    Diet: Heart Healthy    Special Instructions: If you have smoked or chewed Tobacco  in the last 2 yrs please stop smoking, stop any regular Alcohol  and or any Recreational drug use.  On your next visit with your primary care physician please Get Medicines reviewed and adjusted.  Please request your Prim.MD to go over all Hospital Tests and Procedure/Radiological results at the follow up, please get all Hospital records sent to your Prim MD by signing hospital release before you go home.  If you experience worsening of your admission symptoms, develop shortness of breath, life threatening emergency, suicidal or homicidal thoughts you must seek medical attention immediately by calling 911 or calling your MD immediately  if symptoms less severe.  You Must read complete instructions/literature along with all the possible adverse reactions/side effects for all the Medicines you take and that have been prescribed to you. Take any new Medicines after you have completely understood and accpet all the possible adverse reactions/side effects.

## 2021-03-13 NOTE — Plan of Care (Signed)
  Problem: Safety: Goal: Non-violent Restraint(s) Outcome: Adequate for Discharge   Problem: Education: Goal: Knowledge of General Education information will improve Description: Including pain rating scale, medication(s)/side effects and non-pharmacologic comfort measures Outcome: Adequate for Discharge   Problem: Health Behavior/Discharge Planning: Goal: Ability to manage health-related needs will improve Outcome: Adequate for Discharge   Problem: Clinical Measurements: Goal: Will remain free from infection Outcome: Adequate for Discharge   Problem: Activity: Goal: Risk for activity intolerance will decrease Outcome: Adequate for Discharge   Problem: Nutrition: Goal: Adequate nutrition will be maintained Outcome: Adequate for Discharge   Problem: Coping: Goal: Level of anxiety will decrease Outcome: Adequate for Discharge   Problem: Elimination: Goal: Will not experience complications related to bowel motility Outcome: Adequate for Discharge Goal: Will not experience complications related to urinary retention Outcome: Adequate for Discharge   Problem: Safety: Goal: Ability to remain free from injury will improve Outcome: Adequate for Discharge

## 2021-03-15 LAB — ANTINUCLEAR ANTIBODIES, IFA: ANA Ab, IFA: NEGATIVE

## 2021-04-13 ENCOUNTER — Ambulatory Visit: Payer: Self-pay | Admitting: Internal Medicine

## 2022-03-24 ENCOUNTER — Ambulatory Visit (HOSPITAL_COMMUNITY)
Admission: EM | Admit: 2022-03-24 | Discharge: 2022-03-24 | Disposition: A | Discharge status: Home / Self Care | Payer: PRIVATE HEALTH INSURANCE | Attending: Nurse Practitioner | Admitting: Nurse Practitioner

## 2022-03-24 ENCOUNTER — Encounter (HOSPITAL_COMMUNITY): Payer: Self-pay | Admitting: Emergency Medicine

## 2022-03-24 DIAGNOSIS — R509 Fever, unspecified: Secondary | ICD-10-CM

## 2022-03-24 DIAGNOSIS — I10 Essential (primary) hypertension: Secondary | ICD-10-CM | POA: Diagnosis not present

## 2022-03-24 DIAGNOSIS — Z1152 Encounter for screening for COVID-19: Secondary | ICD-10-CM | POA: Insufficient documentation

## 2022-03-24 DIAGNOSIS — R6883 Chills (without fever): Secondary | ICD-10-CM

## 2022-03-24 LAB — POC INFLUENZA A AND B ANTIGEN (URGENT CARE ONLY)
INFLUENZA A ANTIGEN, POC: NEGATIVE
INFLUENZA B ANTIGEN, POC: NEGATIVE

## 2022-03-24 NOTE — Discharge Instructions (Addendum)
Drink plenty of fluids. Stay in home isolation until you receive results of your COVID test. You will only be notified for positive results. You may go online to [place] and review your results. Go to the ED immediately if you get worse or have any other symptoms.  Make sure that you take your blood pressure medication every day to keep your blood pressure at a normal level and decrease your risk for cardiovascular complications.

## 2022-03-24 NOTE — ED Triage Notes (Signed)
Pt reports chill for 4 days. Denies fever. Requesting covid test.

## 2022-03-24 NOTE — ED Provider Notes (Signed)
[facility]    CSN: [id number] Arrival date & time: 03/24/22  1826      History   Chief Complaint Chief Complaint  Patient presents with   Chills    HPI Corey Anthony is a 36 y.o. male.   [place] Interpreter # [phone]  Subjective:   Corey Anthony is a 36 y.o. male who presents for evaluation of suspected fevers but not measured at home and chills. Symptoms have been present for 4 the past days. He denies any runny nose, congestion, sore throat, cough, nausea, vomiting, diarrhea, headache, or dizziness. He has tried to alleviate the symptoms with nothing. He denies any sick contacts or COVID exposure. Patient denies any history COVID. He has been vaccinated against COVID.    The following portions of the patient's history were reviewed and updated as appropriate: allergies, current medications, past family history, past medical history, past social history, past surgical history, and problem list.        Past Medical History:  Diagnosis Date   Alcohol abuse 02/2021   Asthma     Patient Active Problem List   Diagnosis Date Noted   Hyponatremia 03/08/2021   Acute encephalopathy 03/08/2021   Elevated LFTs 03/08/2021   Epigastric pain 03/08/2021   Alcoholism (HCC) 03/08/2021   Delirium tremens (HCC) 03/08/2021   Alcohol withdrawal delirium (HCC) 03/08/2021   Dislocation of fifth toe, left, open, initial encounter 11/14/2018   Closed fracture of third toe of left foot, initial encounter 11/14/2018    Past Surgical History:  Procedure Laterality Date   CLOSED REDUCTION METATARSAL Left 11/14/2018   Procedure: CLOSED REDUCTION METATARSAL 5TH WITH IRRIGATION AND DEBRIDEMENT;  Surgeon: Tarry Kos, MD;  Location: [facility];  Service: Orthopedics;  Laterality: Left;  BLOCK IN PREOP   ORIF TOE FRACTURE Left 11/07/2018   Procedure: OPEN REDUCTION PERCUTANEOUS PINNING LEFT 3RD TOE PROXIMAL PHALANX, IRRIGATION AND DEBRIDEMENT LEFT 5TH TOE DISLOCATION;  Surgeon:  Tarry Kos, MD;  Location: [facility];  Service: Orthopedics;  Laterality: Left;       Home Medications    Prior to Admission medications   Medication Sig Start Date End Date Taking? Authorizing Provider  carvedilol (COREG) 3.125 MG tablet Take 1 tablet (3.125 mg total) by mouth 2 (two) times daily with a meal. 03/13/21   Leroy Sea, MD  chlordiazePOXIDE (LIBRIUM) 5 MG capsule Please dispense 18 pills - Take 1 pill three times a day for 3 days, then Take 1 pill two times a day for 3 days, then Take 1 pill once a day for 3 days and stop. 03/13/21   Leroy Sea, MD  folic acid (FOLVITE) 1 MG tablet Take 1 tablet (1 mg total) by mouth daily. 03/14/21   Leroy Sea, MD  thiamine 100 MG tablet Take 1 tablet (100 mg total) by mouth daily. 03/13/21   Leroy Sea, MD    Family History Family History  Problem Relation Age of Onset   Diabetes Mellitus II Neg Hx     Social History Social History   Tobacco Use   Smoking status: Never   Smokeless tobacco: Never  Vaping Use   Vaping Use: Never used  Substance Use Topics   Alcohol use: Yes   Drug use: Not Currently     Allergies   Squid oil   Review of Systems Review of Systems  Constitutional:  Positive for chills and fever. Negative for fatigue.  HENT:  Negative for  congestion, rhinorrhea and sore throat.   Respiratory:  Negative for cough and shortness of breath.   Gastrointestinal:  Negative for diarrhea, nausea and vomiting.  Musculoskeletal:  Negative for myalgias.  Neurological:  Negative for dizziness and headaches.  All other systems reviewed and are negative.    Physical Exam Triage Vital Signs ED Triage Vitals [03/24/22 1838]  Enc Vitals Group     BP (!) 169/94     Pulse Rate 72     Resp 18     Temp 98.7 F (37.1 C)     Temp Source Oral     SpO2 99 %     Weight      Height      Head Circumference      Peak Flow      Pain Score 0     Pain Loc      Pain Edu?       Excl. in [place]?    No data found.  Updated Vital Signs BP (!) 169/94 (BP Location: Right Arm)   Pulse 72   Temp 98.7 F (37.1 C) (Oral)   Resp 18   SpO2 99%   Visual Acuity Right Eye Distance:   Left Eye Distance:   Bilateral Distance:    Right Eye Near:   Left Eye Near:    Bilateral Near:     Physical Exam   UC Treatments / Results  Labs (all labs ordered are listed, but only abnormal results are displayed) Labs Reviewed  SARS CORONAVIRUS 2 (TAT 6-24 HRS)  POC INFLUENZA A AND B ANTIGEN (URGENT CARE ONLY)    EKG   Radiology No results found.  Procedures Procedures (including critical care time)  Medications Ordered in UC Medications - No data to display  Initial Impression / Assessment and Plan / UC Course  I have reviewed the triage vital signs and the nursing notes.  Pertinent labs & imaging results that were available during my care of the patient were reviewed by me and considered in my medical decision making (see chart for details).    36 yo male presenting with chills and subjective fever.  Patient is afebrile.  Nontoxic.  No focal findings noted.  Flu test was negative. COVID test pending.  Blood pressure incidentally noted to be elevated.  Patient has history of hypertension.  He has not taken his medications today but reports that he will take it when he get home. Supportive care with appropriate antipyretics and fluids advised.  Further recommendations pending results of COVID test.  Today's evaluation has revealed no signs of a dangerous process. Discussed diagnosis with patient and/or guardian. Patient and/or guardian aware of their diagnosis, possible red flag symptoms to watch out for and need for close follow up. Patient and/or guardian understands verbal and written discharge instructions. Patient and/or guardian comfortable with plan and disposition.  Patient and/or guardian has a clear mental status at this time, good insight into illness (after  discussion and teaching) and has clear judgment to make decisions regarding their care  Documentation was completed with the aid of voice recognition software. Transcription may contain typographical errors. Final Clinical Impressions(s) / UC Diagnoses   Final diagnoses:  Chills  Encounter for screening for COVID-19  Elevated blood pressure reading in office with diagnosis of hypertension     Discharge Instructions      Drink plenty of fluids. Stay in home isolation until you receive results of your COVID test. You will only be notified for  positive results. You may go online to MyChart and review your results. Go to the ED immediately if you get worse or have any other symptoms.  Make sure that you take your blood pressure medication every day to keep your blood pressure at a normal level and decrease your risk for cardiovascular complications.     ED Prescriptions   None    PDMP not reviewed this encounter.   Lurline Idol, [place] 03/24/22 2018

## 2022-03-25 LAB — SARS CORONAVIRUS 2 (TAT 6-24 HRS): SARS Coronavirus 2: NEGATIVE

## 2023-06-22 IMAGING — CT CT ABD-PELV W/ CM
2 of 4 series · 16 of 46 positions shown, 18 images · IV contrast (omnipaque)
Comparison: None.

CLINICAL DATA: Abdominal pain

EXAM:
CT ABDOMEN AND PELVIS WITH CONTRAST
TECHNIQUE: Multidetector CT imaging of the abdomen and pelvis was performed
using the standard protocol following bolus administration of
intravenous contrast.
CONTRAST:  80mL OMNIPAQUE IOHEXOL 350 MG/ML SOLN

[Series 3: a/p w/ 5mm · axial · 0.69mm/px · z∈[-1128,-708]mm · 13 of 92 slices shown, 15 images]
[im 4/92  soft-tissue]
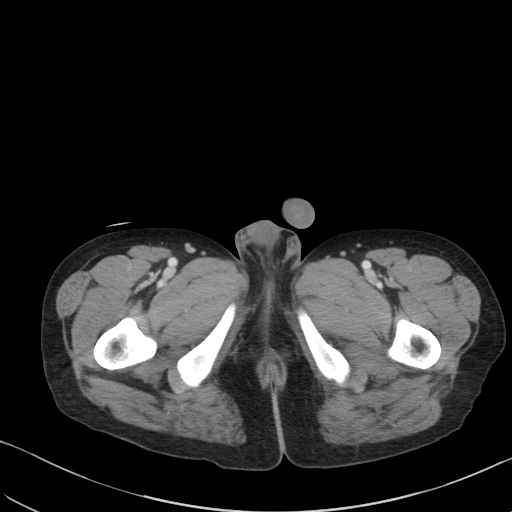
[im 4/92  bone]
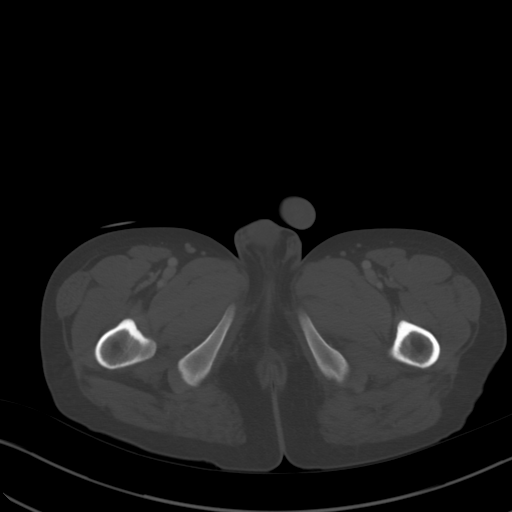
[im 11/92  soft-tissue]
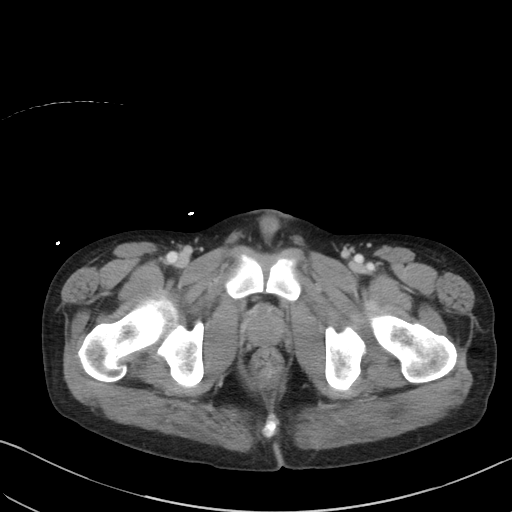
[im 19/92  soft-tissue]
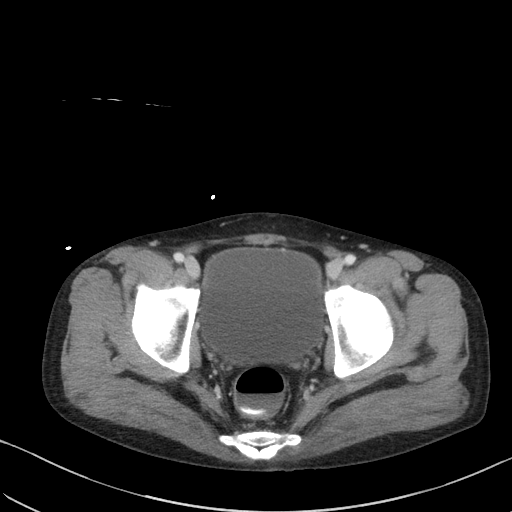
[im 26/92  soft-tissue]
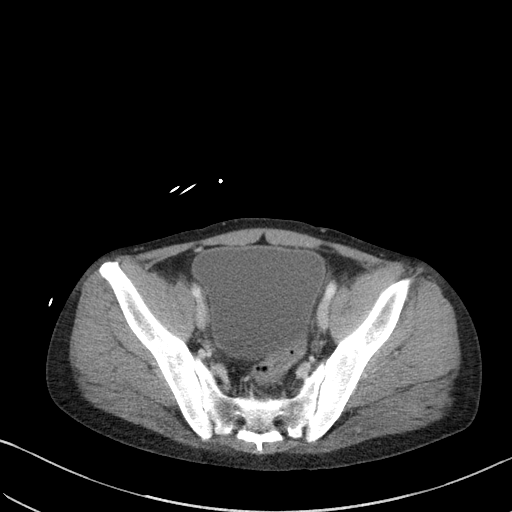
[im 33/92  soft-tissue]
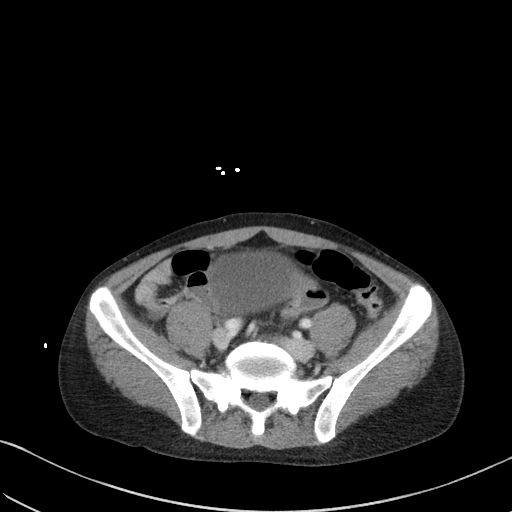
[im 41/92  soft-tissue]
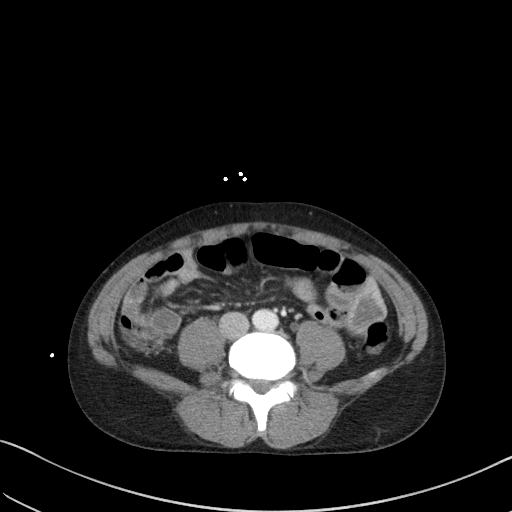
[im 48/92  soft-tissue]
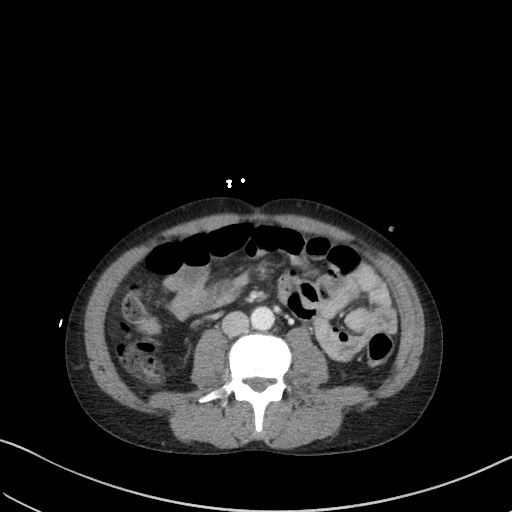
[im 51/92  soft-tissue]
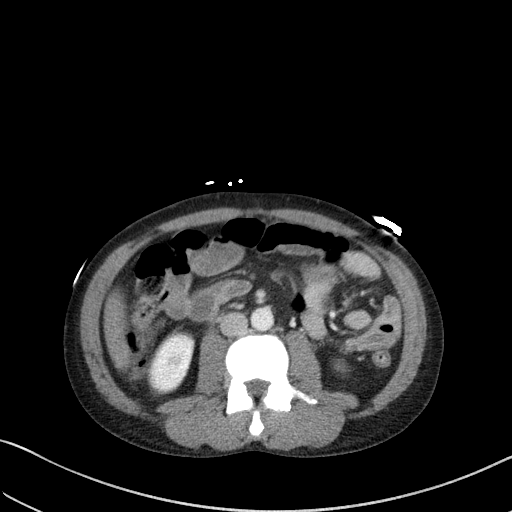
[im 59/92  soft-tissue]
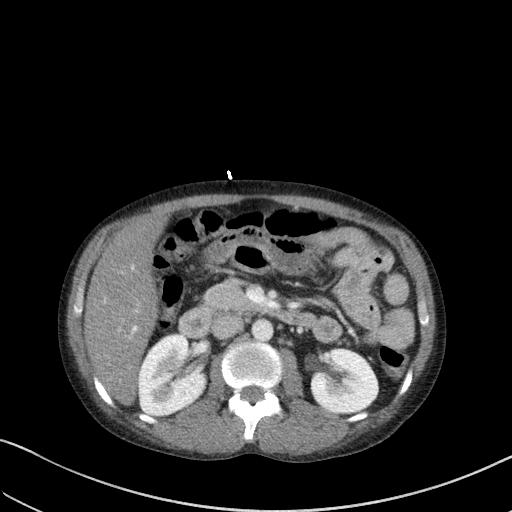
[im 59/92  bone]
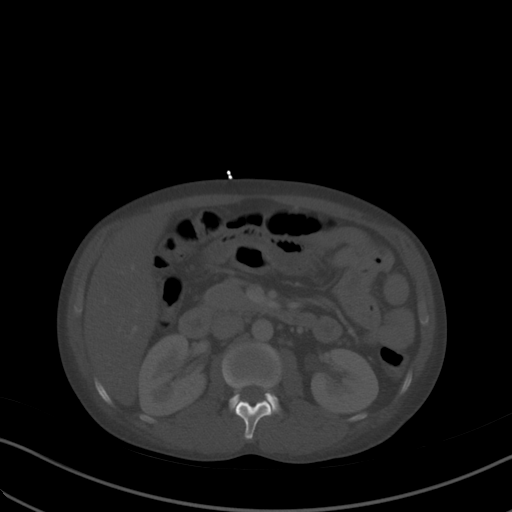
[im 66/92  soft-tissue]
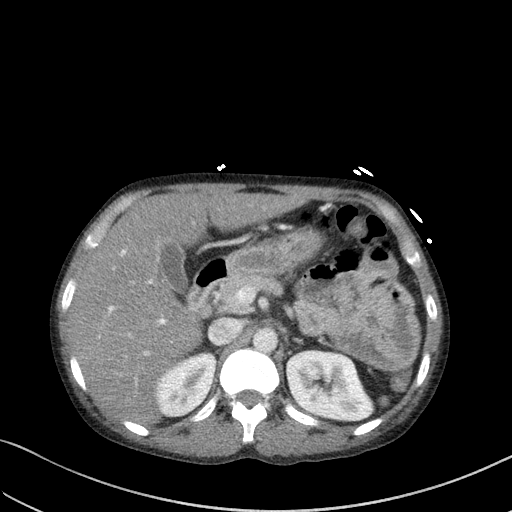
[im 73/92  soft-tissue]
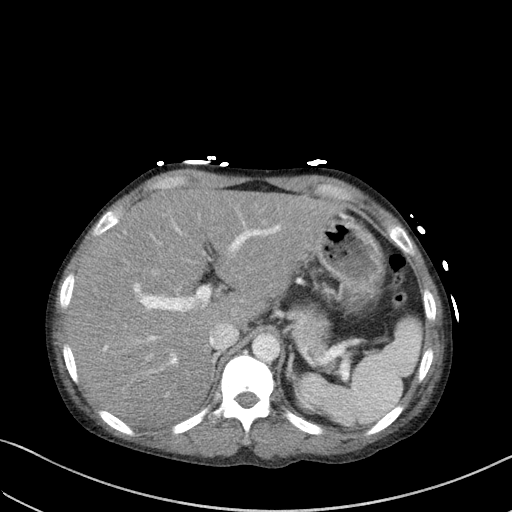
[im 81/92  soft-tissue]
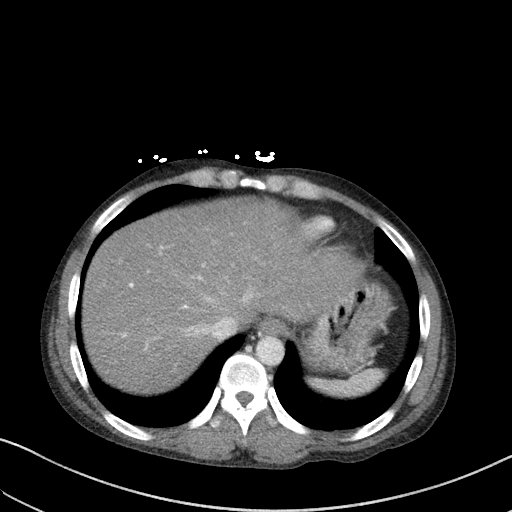
[im 88/92  soft-tissue]
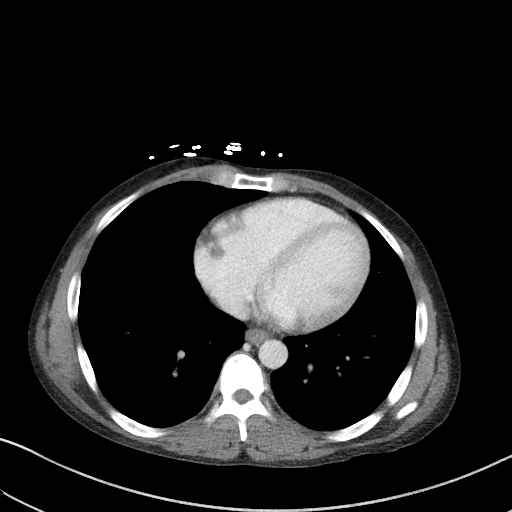

[Series 6: a/p w/ cor · coronal · 0.65mm/px · 3 of 97 slices shown]
[im 33/97  soft-tissue]
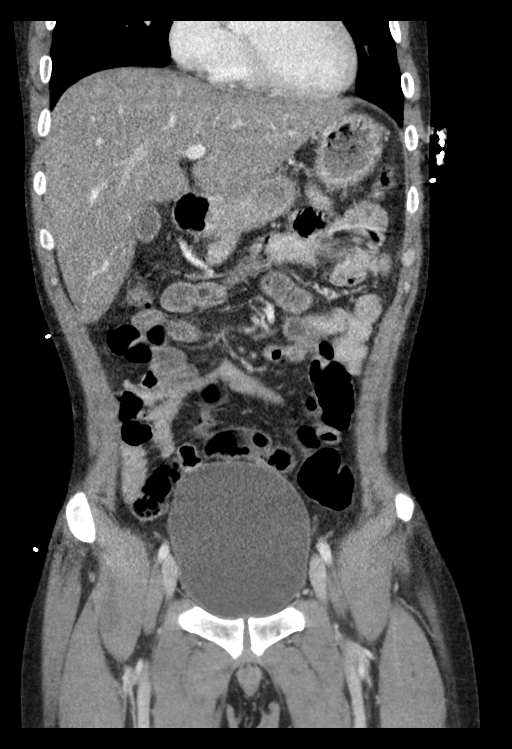
[im 43/97  soft-tissue]
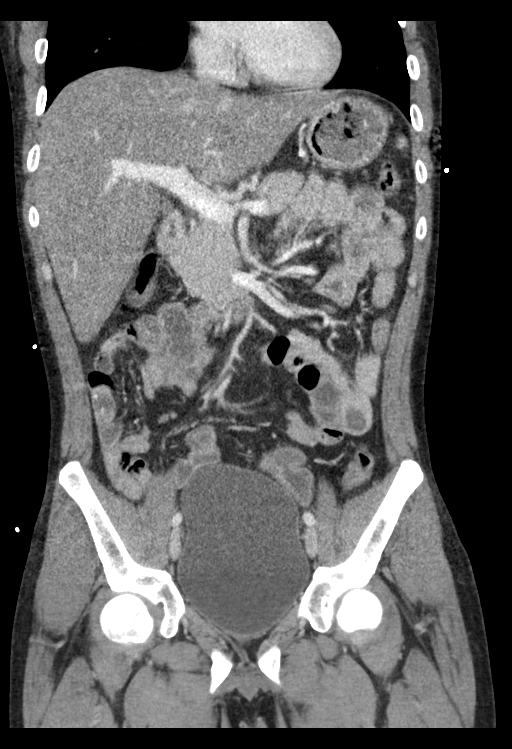
[im 54/97  soft-tissue]
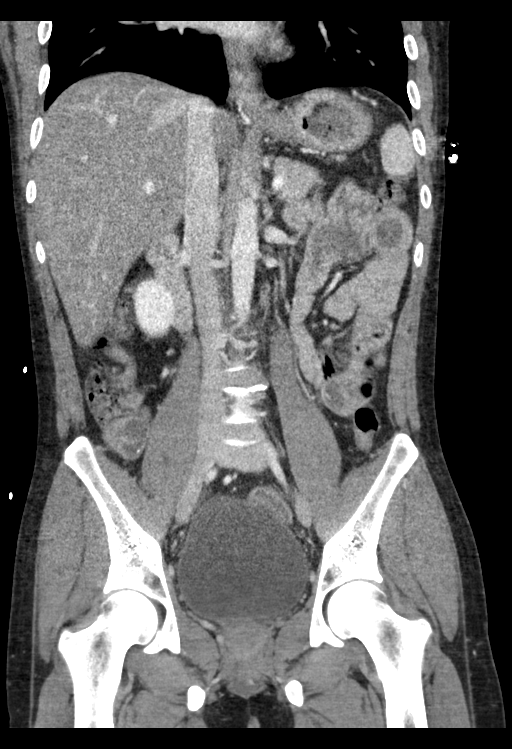

[16 of 46 positions shown; findings below may reference images not displayed]

FINDINGS: Lower chest: Lung bases are clear.

Hepatobiliary: Liver is within normal limits.

Gallbladder is unremarkable. No intrahepatic or extrahepatic ductal
dilatation.

Pancreas: Within normal limits.

Spleen: Within normal limits.

Adrenals/Urinary Tract: Adrenal glands are within normal limits.

Kidneys are within normal limits.  No hydronephrosis.

Bladder is within normal limits.

Stomach/Bowel: Stomach is within normal limits.

No evidence of bowel obstruction.

Normal appendix (coronal image 46).

No colonic wall thickening or inflammatory changes.

Vascular/Lymphatic: No evidence of abdominal aortic aneurysm.

No suspicious abdominopelvic lymphadenopathy.

Reproductive: Prostate is unremarkable.

Other: No abdominopelvic ascites.

Musculoskeletal: Visualized osseous structures are within normal
limits.
IMPRESSION: Negative CT abdomen/pelvis.

## 2023-06-22 IMAGING — DX DG CHEST 1V PORT
1 series · 1 of 1 positions shown · non-contrast
Comparison: 03/07/2021

CLINICAL DATA: Shortness of breath

EXAM:
PORTABLE CHEST 1 VIEW

[chest ap]
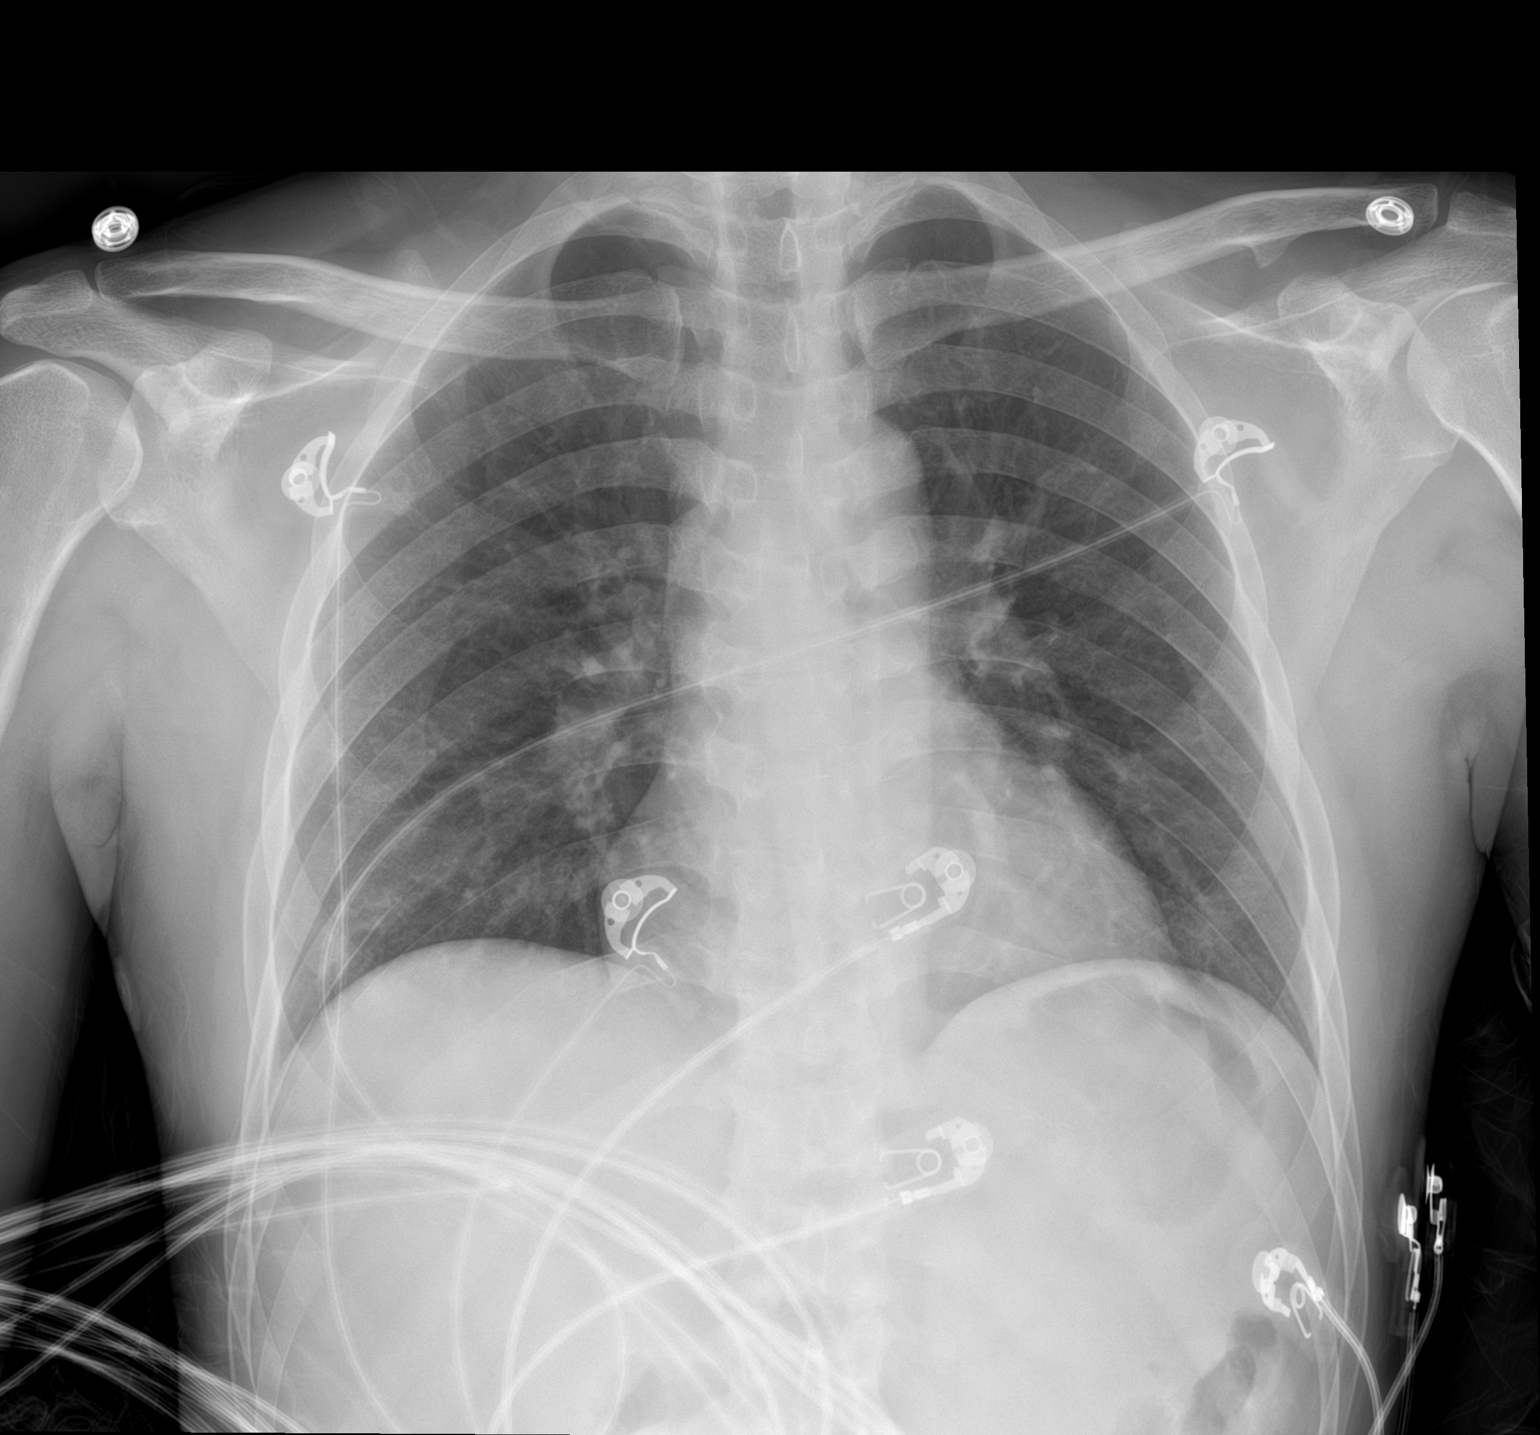

[1 of 1 positions shown; findings below may reference images not displayed]

FINDINGS: The heart size and mediastinal contours are within normal limits.
Both lungs are clear. The visualized skeletal structures are
unremarkable.
IMPRESSION: No active disease.

## 2023-06-23 IMAGING — DX DG CHEST 1V PORT
1 series · 1 of 1 positions shown · non-contrast
Comparison: Chest radiograph 03/08/2021

CLINICAL DATA: Dyspnea

EXAM:
PORTABLE CHEST 1 VIEW

[chest]
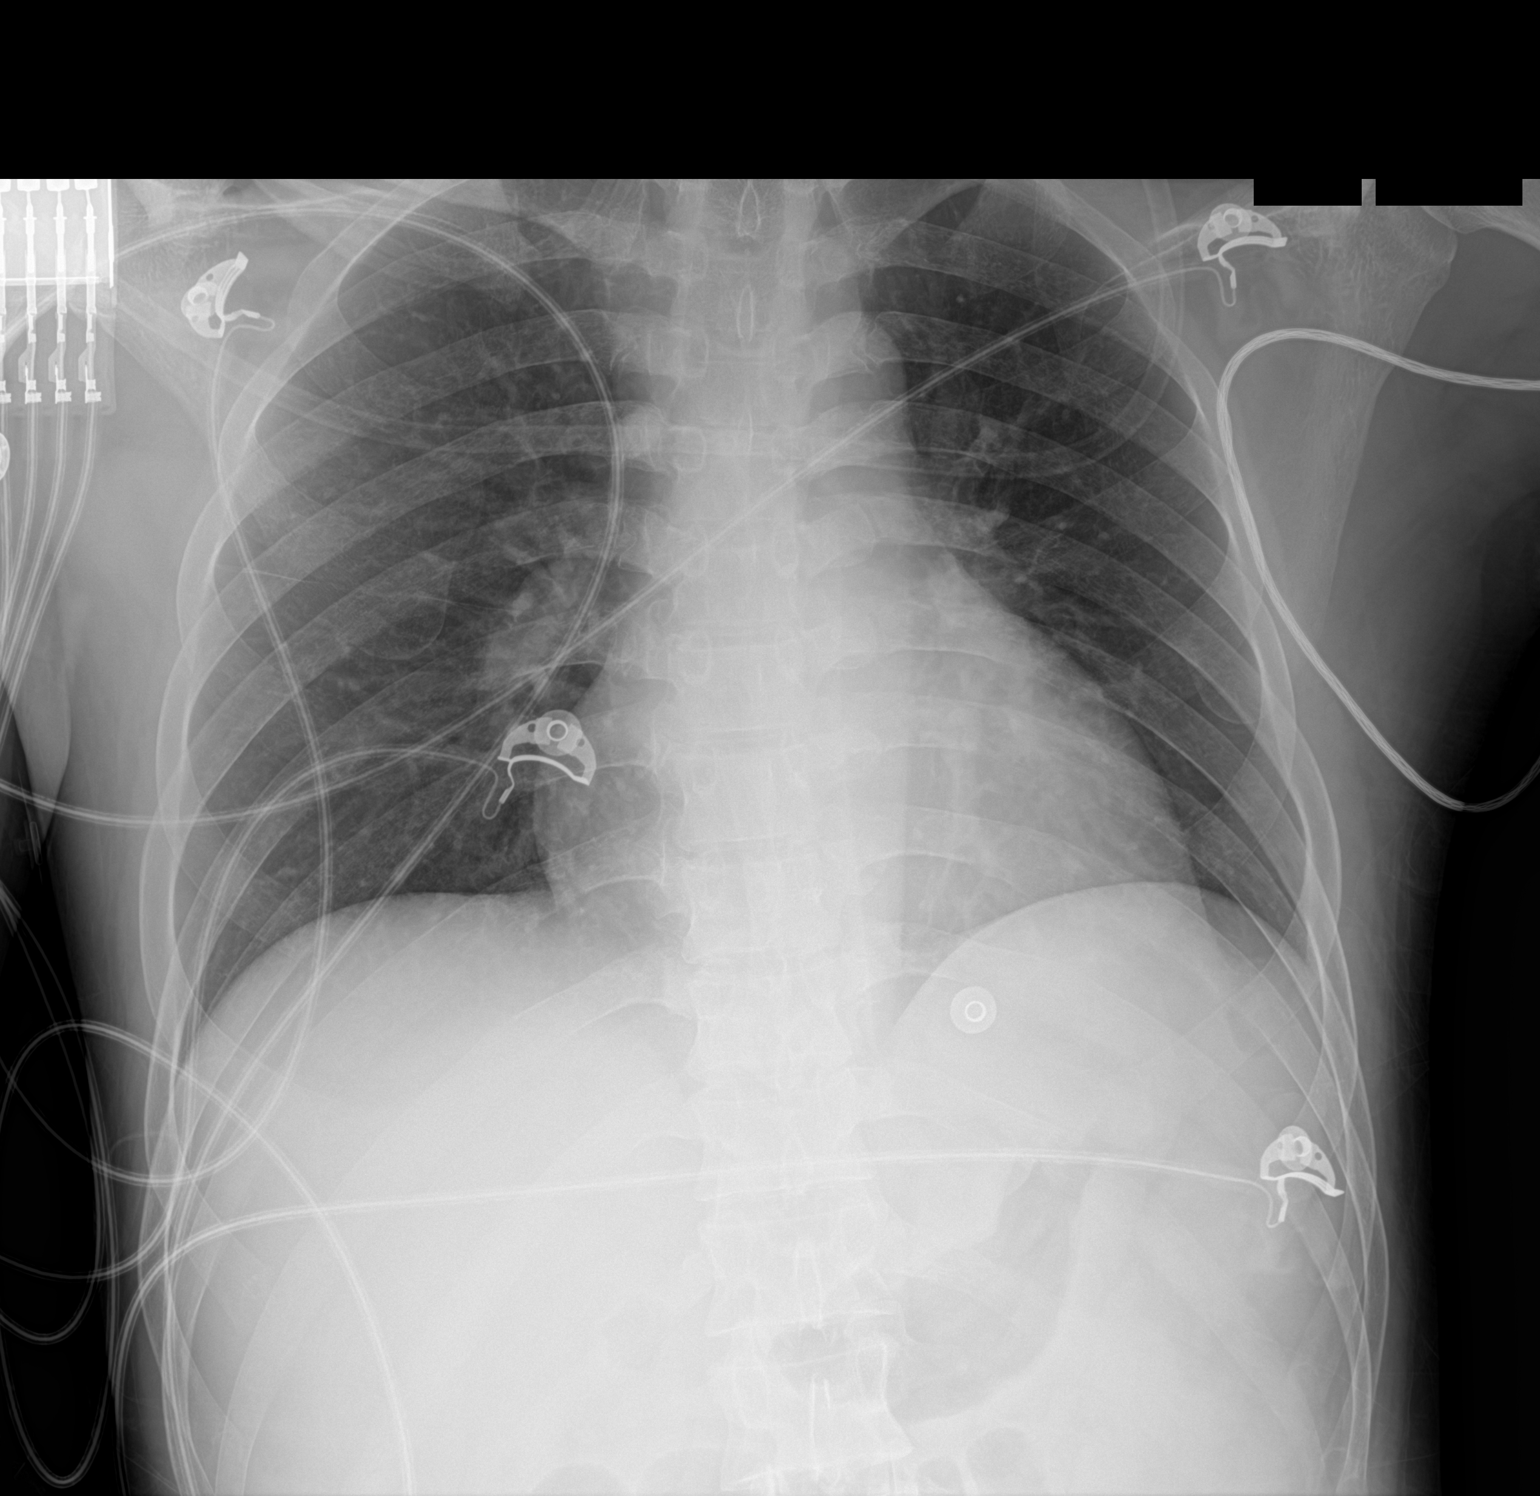

[1 of 1 positions shown; findings below may reference images not displayed]

FINDINGS: The cardiomediastinal silhouette is stable.

There is no focal consolidation or pulmonary edema. There is no
pleural effusion or pneumothorax.

There is no acute osseous abnormality.
IMPRESSION: Stable chest with no radiographic evidence of acute cardiopulmonary
process.
